# Patient Record
Sex: Female | Born: 1995 | Race: White | Hispanic: No | Marital: Single | Smoking: Never smoker
Health system: Southern US, Community
[De-identification: ages and names within clinical notes are randomized; demographics above are authoritative.]

## PROBLEM LIST (undated history)

## (undated) DIAGNOSIS — K219 Gastro-esophageal reflux disease without esophagitis: Secondary | ICD-10-CM

## (undated) DIAGNOSIS — I10 Essential (primary) hypertension: Secondary | ICD-10-CM

## (undated) DIAGNOSIS — R1013 Epigastric pain: Secondary | ICD-10-CM

## (undated) DIAGNOSIS — E785 Hyperlipidemia, unspecified: Secondary | ICD-10-CM

## (undated) DIAGNOSIS — T7840XA Allergy, unspecified, initial encounter: Secondary | ICD-10-CM

## (undated) HISTORY — DX: Epigastric pain: R10.13

## (undated) HISTORY — PX: WISDOM TOOTH EXTRACTION: SHX21

## (undated) HISTORY — DX: Hyperlipidemia, unspecified: E78.5

## (undated) HISTORY — DX: Allergy, unspecified, initial encounter: T78.40XA

## (undated) HISTORY — DX: Essential (primary) hypertension: I10

## (undated) HISTORY — DX: Gastro-esophageal reflux disease without esophagitis: K21.9

---
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
## 2019-06-20 ENCOUNTER — Emergency Department (HOSPITAL_COMMUNITY)
Admission: EM | Admit: 2019-06-20 | Discharge: 2019-06-21 | Disposition: A | Discharge status: Home / Self Care | Payer: BC Managed Care – PPO | Attending: Emergency Medicine | Admitting: Emergency Medicine

## 2019-06-20 ENCOUNTER — Emergency Department (HOSPITAL_COMMUNITY): Payer: BC Managed Care – PPO

## 2019-06-20 ENCOUNTER — Other Ambulatory Visit: Payer: Self-pay

## 2019-06-20 ENCOUNTER — Encounter (HOSPITAL_COMMUNITY): Payer: Self-pay | Admitting: Emergency Medicine

## 2019-06-20 DIAGNOSIS — U071 COVID-19: Secondary | ICD-10-CM

## 2019-06-20 DIAGNOSIS — R05 Cough: Secondary | ICD-10-CM | POA: Diagnosis present

## 2019-06-20 DIAGNOSIS — R079 Chest pain, unspecified: Secondary | ICD-10-CM | POA: Insufficient documentation

## 2019-06-20 DIAGNOSIS — M79605 Pain in left leg: Secondary | ICD-10-CM | POA: Diagnosis not present

## 2019-06-20 LAB — CBC
HCT: 40.7 % (ref 36.0–46.0)
Hemoglobin: 13 g/dL (ref 12.0–15.0)
MCH: 27.5 pg (ref 26.0–34.0)
MCHC: 31.9 g/dL (ref 30.0–36.0)
MCV: 86 fL (ref 80.0–100.0)
Platelets: 351 10*3/uL (ref 150–400)
RBC: 4.73 MIL/uL (ref 3.87–5.11)
RDW: 12.5 % (ref 11.5–15.5)
WBC: 7.6 10*3/uL (ref 4.0–10.5)
nRBC: 0 % (ref 0.0–0.2)

## 2019-06-20 LAB — I-STAT BETA HCG BLOOD, ED (MC, WL, AP ONLY): I-stat hCG, quantitative: <5 m[IU]/mL (ref <5)

## 2019-06-20 MED ORDER — SODIUM CHLORIDE 0.9% FLUSH: 3.0000 mL | Freq: Once | INTRAVENOUS | Status: DC

## 2019-06-20 NOTE — ED Triage Notes (Signed)
Pt states she was diagnosed Covid + last Saturday, today she started having some calf cramping, cp and SOB, concern about blood clots.

## 2019-06-21 ENCOUNTER — Emergency Department (HOSPITAL_COMMUNITY): Payer: BC Managed Care – PPO

## 2019-06-21 ENCOUNTER — Ambulatory Visit (HOSPITAL_BASED_OUTPATIENT_CLINIC_OR_DEPARTMENT_OTHER)
Admission: RE | Admit: 2019-06-21 | Discharge: 2019-06-21 | Disposition: A | Discharge status: Home / Self Care | Payer: BC Managed Care – PPO | Source: Ambulatory Visit | Attending: Physician Assistant | Admitting: Physician Assistant

## 2019-06-21 DIAGNOSIS — M79609 Pain in unspecified limb: Secondary | ICD-10-CM | POA: Diagnosis not present

## 2019-06-21 DIAGNOSIS — U071 COVID-19: Secondary | ICD-10-CM

## 2019-06-21 LAB — BASIC METABOLIC PANEL
Anion gap: 10 (ref 5–15)
BUN: 9 mg/dL (ref 6–20)
CO2: 24 mmol/L (ref 22–32)
Calcium: 8.9 mg/dL (ref 8.9–10.3)
Chloride: 105 mmol/L (ref 98–111)
Creatinine, Ser: 0.81 mg/dL (ref 0.44–1.00)
GFR calc Af Amer: >60 mL/min (ref >60)
GFR calc non Af Amer: >60 mL/min (ref >60)
Glucose, Bld: 115 mg/dL — ABNORMAL HIGH (ref 70–99)
Potassium: 3.7 mmol/L (ref 3.5–5.1)
Sodium: 139 mmol/L (ref 135–145)

## 2019-06-21 LAB — TROPONIN I (HIGH SENSITIVITY)
Troponin I (High Sensitivity): 3 ng/L (ref <18)
Troponin I (High Sensitivity): 6 ng/L (ref <18)

## 2019-06-21 LAB — D-DIMER, QUANTITATIVE (NOT AT ARMC): D-Dimer, Quant: 0.9 ug/mL-FEU — ABNORMAL HIGH (ref 0.00–0.50)

## 2019-06-21 MED ORDER — IOHEXOL 350 MG/ML SOLN
75.0000 mL | Freq: Once | INTRAVENOUS | Status: AC | PRN
  Administered 2019-06-21: 75 mL via INTRAVENOUS

## 2019-06-21 MED ORDER — ENOXAPARIN SODIUM 80 MG/0.8ML ~~LOC~~ SOLN
80.0000 mg | Freq: Once | SUBCUTANEOUS | Status: AC
  Administered 2019-06-21: 80 mg via SUBCUTANEOUS
  Filled 2019-06-21: qty 0.8

## 2019-06-21 NOTE — ED Notes (Signed)
Pt verbalized understanding of d/c instructions pt transported to [facility] via [facility]

## 2019-06-21 NOTE — Discharge Instructions (Addendum)
1. Medications: usual home medications 2. Treatment: rest, drink plenty of fluids,  3. Follow Up: Please utilize the instructions below to return for venous Doppler. If you do not have a primary care doctor use the resource guide provided to find one. Please return to the ER for worsening chest pain, shortness of breath or other concerns.

## 2019-06-21 NOTE — ED Provider Notes (Signed)
[facility] EMERGENCY DEPARTMENT Provider Note   CSN: [id number] Arrival date & time: 06/20/19  2212     History Chief Complaint  Patient presents with  . Chest Pain    Cassidy Shaffer is a 24 y.o. female with a hx of no major medical problems presents to the Emergency Department complaining of gradual, persistent, progressively worsening cough and left leg pain onset yesterday.  Patient reports she was diagnosed with COVID-19 on 06/11/2019.  Patient reports that her sister was recently diagnosed as well.  She reports she has been confined to her room for the last 10 days so has not been particularly mobile.  She does not take a birth control, no recent surgeries, no history of DVT.  Patient reports that yesterday her left calf began to feel "crampy" and did not resolve with massage or walking.  Patient reports mild chest pain and shortness of breath.  No dyspnea on exertion.  She does report she is concerned about possible pulmonary embolism.  She denies palpitations, noticeable leg swelling.  Nothing seems to make her symptoms better or worse.   The history is provided by the patient and medical records. No language interpreter was used.       History reviewed. No pertinent past medical history.  There are no problems to display for this patient.   History reviewed. No pertinent surgical history.   OB History   No obstetric history on file.     No family history on file.  Social History   Tobacco Use  . Smoking status: Never Smoker  . Smokeless tobacco: Never Used  Substance Use Topics  . Alcohol use: Never  . Drug use: Never    Home Medications Prior to Admission medications   Not on File    Allergies    Patient has no known allergies.  Review of Systems   Review of Systems  Constitutional: Negative for appetite change, diaphoresis, fatigue, fever and unexpected weight change.  HENT: Negative for mouth sores.   Eyes: Negative for visual  disturbance.  Respiratory: Positive for cough and shortness of breath. Negative for chest tightness and wheezing.   Cardiovascular: Positive for chest pain.  Gastrointestinal: Negative for abdominal pain, constipation, diarrhea, nausea and vomiting.  Endocrine: Negative for polydipsia, polyphagia and polyuria.  Genitourinary: Negative for dysuria, frequency, hematuria and urgency.  Musculoskeletal: Positive for myalgias (left leg). Negative for back pain and neck stiffness.  Skin: Negative for rash.  Allergic/Immunologic: Negative for immunocompromised state.  Neurological: Negative for syncope, light-headedness and headaches.  Hematological: Does not bruise/bleed easily.  Psychiatric/Behavioral: Negative for sleep disturbance. The patient is not nervous/anxious.     Physical Exam Updated Vital Signs BP (!) 150/94 (BP Location: Right Arm)   Pulse 97   Temp 98.3 F (36.8 C) (Oral)   Resp 18   Ht 5\' 7"  (1.702 m)   Wt 77.1 kg   SpO2 99%   BMI 26.63 kg/m   Physical Exam Vitals and nursing note reviewed.  Constitutional:      General: She is not in acute distress.    Appearance: She is not diaphoretic.  HENT:     Head: Normocephalic.  Eyes:     General: No scleral icterus.    Conjunctiva/sclera: Conjunctivae normal.  Cardiovascular:     Rate and Rhythm: Regular rhythm. Tachycardia present.     Pulses: Normal pulses.          Radial pulses are 2+ on the right side and  2+ on the left side.  Pulmonary:     Effort: No tachypnea, accessory muscle usage, prolonged expiration, respiratory distress or retractions.     Breath sounds: Normal breath sounds. No stridor.     Comments: Equal chest rise. No increased work of breathing. Abdominal:     General: There is no distension.     Palpations: Abdomen is soft.     Tenderness: There is no abdominal tenderness. There is no guarding or rebound.  Musculoskeletal:     Cervical back: Normal range of motion.     Right lower leg: No  tenderness. No edema.     Left lower leg: Tenderness ( Mild calf tenderness, no palpable cord) present. No edema.     Comments: Moves all extremities equally and without difficulty.  Skin:    General: Skin is warm and dry.     Capillary Refill: Capillary refill takes less than 2 seconds.  Neurological:     Mental Status: She is alert.     GCS: GCS eye subscore is 4. GCS verbal subscore is 5. GCS motor subscore is 6.     Comments: Speech is clear and goal oriented.  Psychiatric:        Mood and Affect: Mood normal.     ED Results / Procedures / Treatments   Labs (all labs ordered are listed, but only abnormal results are displayed) Labs Reviewed  BASIC METABOLIC PANEL - Abnormal; Notable for the following components:      Result Value   Glucose, Bld 115 (*)    All other components within normal limits  D-DIMER, QUANTITATIVE (NOT AT [facility]) - Abnormal; Notable for the following components:   D-Dimer, Quant 0.90 (*)    All other components within normal limits  CBC  I-STAT BETA HCG BLOOD, ED ([facility], [facility], [facility] ONLY)  TROPONIN I (HIGH SENSITIVITY)  TROPONIN I (HIGH SENSITIVITY)    EKG EKG Interpretation  Date/Time:  Monday Jun 21 2019 03:35:29 EDT Ventricular Rate:  114 PR Interval:  144 QRS Duration: 94 QT Interval:  355 QTC Calculation: 489 R Axis:   84 Text Interpretation: Sinus tachycardia Borderline prolonged QT interval No significant change was found Confirmed by Glynn Octave [phone]) on 06/21/2019 3:41:12 AM   Radiology DG Chest 2 View  Result Date: 06/20/2019 CLINICAL DATA:  Chest pain. EXAM: CHEST - 2 VIEW COMPARISON:  None. FINDINGS: The heart size and mediastinal contours are within normal limits. Both lungs are clear. The visualized skeletal structures are unremarkable. IMPRESSION: No active cardiopulmonary disease. Electronically Signed   By: Aram Candela M.D.   On: 06/20/2019 22:50   CT Angio Chest PE W and/or Wo Contrast  Result Date: 06/21/2019 CLINICAL  DATA:  Chest pain and shortness of breath. COVID positive. EXAM: CT ANGIOGRAPHY CHEST WITH CONTRAST TECHNIQUE: Multidetector CT imaging of the chest was performed using the standard protocol during bolus administration of intravenous contrast. Multiplanar CT image reconstructions and MIPs were obtained to evaluate the vascular anatomy. CONTRAST:  87mL OMNIPAQUE IOHEXOL 350 MG/ML SOLN COMPARISON:  Chest x-ray from same day. FINDINGS: Cardiovascular: Satisfactory opacification of the pulmonary arteries to the segmental level. No evidence of pulmonary embolism. Normal heart size. No pericardial effusion. Mediastinum/Nodes: No enlarged mediastinal, hilar, or axillary lymph nodes. Thyroid gland, trachea, and esophagus demonstrate no significant findings. Lungs/Pleura: Lungs are clear. No pleural effusion or pneumothorax. Upper Abdomen: No acute abnormality. Musculoskeletal: No chest wall abnormality. No acute or significant osseous findings. Review of the MIP images confirms the  above findings. IMPRESSION: No evidence of pulmonary embolism. No acute intrathoracic process. Electronically Signed   By: Obie Dredge M.D.   On: 06/21/2019 06:20    Procedures Procedures (including critical care time)  Medications Ordered in ED Medications  sodium chloride flush (NS) 0.9 % injection 3 mL (has no administration in time range)  enoxaparin (LOVENOX) injection 80 mg (has no administration in time range)  iohexol (OMNIPAQUE) 350 MG/ML injection 75 mL (75 mLs Intravenous Contrast Given 06/21/19 0537)    ED Course  I have reviewed the triage vital signs and the nursing notes.  Pertinent labs & imaging results that were available during my care of the patient were reviewed by me and considered in my medical decision making (see chart for details).    MDM Rules/Calculators/A&P                       Patient presents approximately 10 days after being diagnosed with Covid with new onset chest pain, shortness of  breath and left calf tenderness.  Concern for possible pulmonary embolism.  D-dimer elevated.  CT scan pending.  No hypoxia on exam.  Patient does have mild tachycardia.  6:41 AM CT scan without evidence of pulmonary embolism.  Do believe patient is likely high risk for DVT.  Will give Lovenox and schedule for outpatient venous duplex this morning.  Discussed reasons to return immediately to the emergency department.  Patient states understanding and is in agreement with the plan.   Final Clinical Impression(s) / ED Diagnoses Final diagnoses:  Pain of left lower extremity  COVID-19  Nonspecific chest pain    Rx / DC Orders ED Discharge Orders         Ordered    VAS [place] LOWER EXTREMITY VENOUS (DVT)     06/21/19 0639           Gloris Shiroma, Dahlia Client, PA-C 06/21/19 2683    Glynn Octave, MD 06/21/19 [phone]

## 2019-11-15 ENCOUNTER — Ambulatory Visit: Payer: BC Managed Care – PPO | Attending: Internal Medicine

## 2019-11-15 DIAGNOSIS — Z23 Encounter for immunization: Secondary | ICD-10-CM

## 2019-11-15 NOTE — Progress Notes (Signed)
   Covid-19 Vaccination Clinic  Name:  Cassidy Shaffer    MRN: [id number] DOB: Nov 30, 1995  11/15/2019  [name] was observed post Covid-19 immunization for 15 minutes without incident. She was provided with Vaccine Information Sheet and instruction to access the V-Safe system.   [name] was instructed to call 911 with any severe reactions post vaccine: Marland Kitchen Difficulty breathing  . Swelling of face and throat  . A fast heartbeat  . A bad rash all over body  . Dizziness and weakness   Immunizations Administered    Name Date Dose VIS Date Route   Pfizer COVID-19 Vaccine 11/15/2019 11:31 AM 0.3 mL 04/07/2018 Intramuscular   Manufacturer: [employer], [employer]   Lot: [id number]   NDC: [address]-8

## 2019-11-16 ENCOUNTER — Other Ambulatory Visit: Payer: Self-pay

## 2019-11-16 ENCOUNTER — Emergency Department (HOSPITAL_BASED_OUTPATIENT_CLINIC_OR_DEPARTMENT_OTHER)
Admission: EM | Admit: 2019-11-16 | Discharge: 2019-11-16 | Disposition: A | Discharge status: Home / Self Care | Payer: BC Managed Care – PPO | Attending: Emergency Medicine | Admitting: Emergency Medicine

## 2019-11-16 ENCOUNTER — Encounter (HOSPITAL_BASED_OUTPATIENT_CLINIC_OR_DEPARTMENT_OTHER): Payer: Self-pay | Admitting: *Deleted

## 2019-11-16 ENCOUNTER — Emergency Department (HOSPITAL_BASED_OUTPATIENT_CLINIC_OR_DEPARTMENT_OTHER): Payer: BC Managed Care – PPO

## 2019-11-16 DIAGNOSIS — R000 Tachycardia, unspecified: Secondary | ICD-10-CM | POA: Insufficient documentation

## 2019-11-16 DIAGNOSIS — R072 Precordial pain: Secondary | ICD-10-CM | POA: Diagnosis not present

## 2019-11-16 DIAGNOSIS — R0789 Other chest pain: Secondary | ICD-10-CM

## 2019-11-16 DIAGNOSIS — M79602 Pain in left arm: Secondary | ICD-10-CM | POA: Insufficient documentation

## 2019-11-16 LAB — BASIC METABOLIC PANEL
Anion gap: 8 (ref 5–15)
BUN: 7 mg/dL (ref 6–20)
CO2: 23 mmol/L (ref 22–32)
Calcium: 9 mg/dL (ref 8.9–10.3)
Chloride: 105 mmol/L (ref 98–111)
Creatinine, Ser: 0.62 mg/dL (ref 0.44–1.00)
GFR calc non Af Amer: >60 mL/min (ref >60)
Glucose, Bld: 90 mg/dL (ref 70–99)
Potassium: 3.6 mmol/L (ref 3.5–5.1)
Sodium: 136 mmol/L (ref 135–145)

## 2019-11-16 LAB — D-DIMER, QUANTITATIVE: D-Dimer, Quant: 0.65 ug/mL-FEU — ABNORMAL HIGH (ref 0.00–0.50)

## 2019-11-16 LAB — CBC WITH DIFFERENTIAL/PLATELET
Abs Immature Granulocytes: 0.02 10*3/uL (ref 0.00–0.07)
Basophils Absolute: 0.1 10*3/uL (ref 0.0–0.1)
Basophils Relative: 1 %
Eosinophils Absolute: 0 10*3/uL (ref 0.0–0.5)
Eosinophils Relative: 1 %
HCT: 39 % (ref 36.0–46.0)
Hemoglobin: 12.9 g/dL (ref 12.0–15.0)
Immature Granulocytes: 0 %
Lymphocytes Relative: 8 %
Lymphs Abs: 0.6 10*3/uL — ABNORMAL LOW (ref 0.7–4.0)
MCH: 28.5 pg (ref 26.0–34.0)
MCHC: 33.1 g/dL (ref 30.0–36.0)
MCV: 86.3 fL (ref 80.0–100.0)
Monocytes Absolute: 0.6 10*3/uL (ref 0.1–1.0)
Monocytes Relative: 8 %
Neutro Abs: 5.4 10*3/uL (ref 1.7–7.7)
Neutrophils Relative %: 82 %
Platelets: 263 10*3/uL (ref 150–400)
RBC: 4.52 MIL/uL (ref 3.87–5.11)
RDW: 12.5 % (ref 11.5–15.5)
WBC: 6.6 10*3/uL (ref 4.0–10.5)
nRBC: 0 % (ref 0.0–0.2)

## 2019-11-16 LAB — TROPONIN I (HIGH SENSITIVITY)
Troponin I (High Sensitivity): <2 ng/L (ref <18)
Troponin I (High Sensitivity): <2 ng/L (ref <18)

## 2019-11-16 MED ORDER — IOHEXOL 350 MG/ML SOLN
100.0000 mL | Freq: Once | INTRAVENOUS | Status: AC | PRN
  Administered 2019-11-16: 75 mL via INTRAVENOUS

## 2019-11-16 NOTE — ED Provider Notes (Signed)
[facility] EMERGENCY DEPARTMENT Provider Note   CSN: [id number] Arrival date & time: 11/16/19  0254     History Chief Complaint  Patient presents with  . Chest Pain    Cassidy Shaffer is a 24 y.o. female.  HPI   Patient with no significant medical history presents to emergency department with chief complaint of intermittent substernal chest pain and left arm pain x3 weeks.  Patient states this pain started after she got her Covid shot 3 weeks ago.  She states she will have episodic chest pain, it is sometimes substernal or in her upper left pec.  She describes it as a  dull pain that will last sometimes 5 minutes or up until an hour.  She denies any associated symptoms like becoming, short of breath, diaphoretic, radiating pain, paresthesias, nausea, vomiting, feeling lightheaded, not exertional.  She has no cardiac history, no history of DVTs or PEs, not on birth control.  Patient states it hurts more when she palpates the area.  She also endorses that her left arm has been hurting intermittently since getting her Covid shot 3 weeks ago.  She states she just got her second Covid shot yesterday and had increased pain in that left arm.  She denies any paresthesias, weakness, decreased range of motion in that left arm.  Patient denies any alleviating factors.  Patient denies headache, fever, chills, shortness of breath, abdominal pain, nausea, vomiting, diarrhea, pedal edema.  History reviewed. No pertinent past medical history.  There are no problems to display for this patient.   Past Surgical History:  Procedure Laterality Date  . WISDOM TOOTH EXTRACTION       OB History   No obstetric history on file.     History reviewed. No pertinent family history.  Social History   Tobacco Use  . Smoking status: Never Smoker  . Smokeless tobacco: Never Used  Substance Use Topics  . Alcohol use: Never  . Drug use: Never    Home Medications Prior to Admission  medications   Not on File    Allergies    Patient has no known allergies.  Review of Systems   Review of Systems  Constitutional: Negative for chills and fever.  HENT: Negative for congestion, sore throat, tinnitus, trouble swallowing and voice change.   Eyes: Negative for visual disturbance.  Respiratory: Negative for cough and shortness of breath.   Cardiovascular: Positive for chest pain. Negative for palpitations and leg swelling.  Gastrointestinal: Negative for abdominal pain, diarrhea, nausea and vomiting.  Genitourinary: Negative for dysuria, enuresis, flank pain and genital sores.  Musculoskeletal: Negative for back pain.       Left arm stiffness.  Skin: Negative for rash.  Neurological: Negative for dizziness and headaches.  Hematological: Does not bruise/bleed easily.    Physical Exam Updated Vital Signs BP 118/87 (BP Location: Right Arm)   Pulse 95   Temp 99.1 F (37.3 C) (Oral)   Resp 16   Ht 5\' 7"  (1.702 m)   Wt 77.1 kg   LMP 11/04/2019   SpO2 100%   BMI 26.63 kg/m   Physical Exam Vitals and nursing note reviewed.  Constitutional:      General: She is not in acute distress.    Appearance: She is not ill-appearing.  HENT:     Head: Normocephalic and atraumatic.     Nose: No congestion.     Mouth/Throat:     Mouth: Mucous membranes are moist.     Pharynx:  Oropharynx is clear.  Eyes:     General: No scleral icterus. Cardiovascular:     Rate and Rhythm: Regular rhythm. Tachycardia present.     Pulses: Normal pulses.     Heart sounds: No murmur heard.  No friction rub. No gallop.      Comments: Patient's chest was visualized, no gross abnormalities noted, good rise and fall during respirations, tenderness to palpation along her sternum. Pulmonary:     Effort: No respiratory distress.     Breath sounds: No wheezing, rhonchi or rales.  Chest:     Chest wall: No tenderness.  Abdominal:     General: There is no distension.     Palpations: Abdomen is  soft.     Tenderness: There is no abdominal tenderness. There is no right CVA tenderness, left CVA tenderness or guarding.  Musculoskeletal:        General: No swelling.     Right lower leg: No edema.     Left lower leg: No edema.  Skin:    General: Skin is warm and dry.     Capillary Refill: Capillary refill takes less than 2 seconds.     Findings: No rash.  Neurological:     Mental Status: She is alert.  Psychiatric:        Mood and Affect: Mood normal.     ED Results / Procedures / Treatments   Labs (all labs ordered are listed, but only abnormal results are displayed) Labs Reviewed  D-DIMER, QUANTITATIVE (NOT AT [facility]) - Abnormal; Notable for the following components:      Result Value   D-Dimer, Quant 0.65 (*)    All other components within normal limits  CBC WITH DIFFERENTIAL/PLATELET - Abnormal; Notable for the following components:   Lymphs Abs 0.6 (*)    All other components within normal limits  BASIC METABOLIC PANEL  TROPONIN I (HIGH SENSITIVITY)  TROPONIN I (HIGH SENSITIVITY)    EKG EKG Interpretation  Date/Time:  Tuesday November 16 2019 09:00:19 EDT Ventricular Rate:  92 PR Interval:    QRS Duration: 94 QT Interval:  343 QTC Calculation: 425 R Axis:   75 Text Interpretation: Sinus rhythm Confirmed by Marianna Fuss ([id number]) on 11/16/2019 9:25:50 AM   Radiology CT Angio Chest PE W and/or Wo Contrast  Result Date: 11/16/2019 CLINICAL DATA:  Chest pain for 2 weeks. EXAM: CT ANGIOGRAPHY CHEST WITH CONTRAST TECHNIQUE: Multidetector CT imaging of the chest was performed using the standard protocol during bolus administration of intravenous contrast. Multiplanar CT image reconstructions and MIPs were obtained to evaluate the vascular anatomy. CONTRAST:  78mL OMNIPAQUE IOHEXOL 350 MG/ML SOLN COMPARISON:  Chest radiograph performed the same day and CT chest dated 06/21/2019. FINDINGS: Cardiovascular: Satisfactory opacification of the pulmonary arteries to the  segmental level. No evidence of pulmonary embolism. Normal heart size. No pericardial effusion. Mediastinum/Nodes: No enlarged mediastinal, hilar, or axillary lymph nodes. Thyroid gland, trachea, and esophagus demonstrate no significant findings. Lungs/Pleura: Lungs are clear. No pleural effusion or pneumothorax. Upper Abdomen: No acute abnormality. Musculoskeletal: No chest wall abnormality. No acute or significant osseous findings. Review of the MIP images confirms the above findings. IMPRESSION: Negative for pulmonary embolism or acute pulmonary process. Electronically Signed   By: Romona Curls M.D.   On: 11/16/2019 11:48   DG Chest Port 1 View  Result Date: 11/16/2019 CLINICAL DATA:  Chest pain EXAM: PORTABLE CHEST 1 VIEW COMPARISON:  Jun 20, 2019 chest radiograph; Jun 21, 2019 chest CT FINDINGS: Lungs are  clear. Heart size and pulmonary vascularity are normal. No adenopathy. No pneumothorax. No bone lesions. IMPRESSION: Lungs clear.  Cardiac silhouette normal. Electronically Signed   By: Bretta Bang III M.D.   On: 11/16/2019 10:04    Procedures Procedures (including critical care time)  Medications Ordered in ED Medications  iohexol (OMNIPAQUE) 350 MG/ML injection 100 mL (75 mLs Intravenous Contrast Given 11/16/19 1125)    ED Course  I have reviewed the triage vital signs and the nursing notes.  Pertinent labs & imaging results that were available during my care of the patient were reviewed by me and considered in my medical decision making (see chart for details).  Clinical Course as of Nov 15 1225  Tue Nov 16, 2019  1049 D-Dimer, Quant(!): 0.65 [WF]    Clinical Course User Index [WF] Barnie Del   MDM Rules/Calculators/A&P                         I have personally reviewed all imaging, labs and have interpreted them.  Patient presents with chest pain started at 6:30 AM and left arm pain.  She was alert, did not appear in acute distress, vital signs significant  for tachycardia.  Will order chest pain work-up and D-dimer for further evaluation.   BMP does not show electrolyte abnormalities, no metabolic acidosis, no signs of AKI, no anion gap.  CBC negative for leukocytosis or anemia.  D-dimer slightly elevated at 0.65 will send for CT angio for PE rule out.  Initial troponin less than 2. CT angio was negative for PE, no other acute findings noted.  Chest x-ray was performed negative for acute findings.  EKG shows sinus without signs of ischemia no ST elevation or depression noted.  I have low suspicion patient suffering from ACS as she has a heart score of 0, initial troponin was less than 2, EKG did not show signs of ischemia.  Second troponin was not indicated per heart score pathway.  I suspect initial tachycardia secondary to anxiety as patient states she was very nervous coming in to be evaluated.  Low suspicion for PE as patient CT angio came back unremarkable.  Low suspicion for DVT as patient denies leg pain, leg swelling, no history of DVTs in the past.  Low suspicion for systemic infection as patient is nontoxic-appearing, vital signs reassuring, no obvious source infection on exam, chest x-ray was negative.  Low suspicion for AAA as patient denies stabbing pain in the center of her back, low risk factors.  I suspect patient suffering from muscular strain.  But differential diagnosis includes costochondritis, anxiety, acid reflux.  Will recommend over-the-counter pain medication and follow-up with PCP for further evaluation.  Patient vital signs have remained stable, no indication for hospital admission.  Patient is given at home care and  strict return precautions.  Patient verbalized understood and agreed to plan.   Final Clinical Impression(s) / ED Diagnoses Final diagnoses:  Atypical chest pain    Rx / DC Orders ED Discharge Orders    None       Carroll Sage, PA-C 11/16/19 1227    Milagros Loll, MD 11/17/19 9509     Milagros Loll, MD 11/17/19 [phone]

## 2019-11-16 NOTE — Discharge Instructions (Signed)
Seen here for chest pain, lab work and imaging look reassuring.  I recommend taking over-the-counter pain medications like ibuprofen and or Tylenol every 6 as needed for pain.  Please follow dosing the back of bottle.  I want you to follow-up with your PCP for further evaluation management if symptoms persist.  Please come back to emergency department if you develop severe chest pain, shortness of breath, severe abdominal pain, uncontrolled nausea, vomiting, diarrhea.

## 2019-11-16 NOTE — ED Triage Notes (Signed)
Chest pain for 2 weeks

## 2019-11-22 ENCOUNTER — Encounter: Payer: Self-pay | Admitting: Nurse Practitioner

## 2019-12-01 ENCOUNTER — Other Ambulatory Visit: Payer: Self-pay

## 2019-12-01 ENCOUNTER — Ambulatory Visit (INDEPENDENT_AMBULATORY_CARE_PROVIDER_SITE_OTHER): Payer: BC Managed Care – PPO | Admitting: Nurse Practitioner

## 2019-12-01 ENCOUNTER — Encounter: Payer: Self-pay | Admitting: Nurse Practitioner

## 2019-12-01 VITALS — BP 128/78 | HR 68 | Temp 98.1°F | Ht 66.5 in | Wt 156.0 lb

## 2019-12-01 DIAGNOSIS — R053 Chronic cough: Secondary | ICD-10-CM | POA: Diagnosis not present

## 2019-12-01 DIAGNOSIS — U099 Post covid-19 condition, unspecified: Secondary | ICD-10-CM

## 2019-12-01 DIAGNOSIS — K219 Gastro-esophageal reflux disease without esophagitis: Secondary | ICD-10-CM

## 2019-12-01 DIAGNOSIS — Z0000 Encounter for general adult medical examination without abnormal findings: Secondary | ICD-10-CM | POA: Insufficient documentation

## 2019-12-01 MED ORDER — OMEPRAZOLE 20 MG PO CPDR: 20.0000 mg | DELAYED_RELEASE_CAPSULE | Freq: Every day | ORAL | 1 refills | Status: DC

## 2019-12-01 NOTE — Progress Notes (Signed)
New Patient Office Visit  Subjective:  Patient ID: Cassidy Shaffer, female    DOB: 03-26-1995  Age: 24 y.o. MRN: [id number]  CC:  Chief Complaint  Patient presents with  . New to establish    Side effects after second COVID vaccine    HPI Cassidy Shaffer is a 24 year old Caucasian female that is here to for physical exam to establish care. She is a Charity fundraiser at [employer]. Recently was required to receive COVID-19 vaccination on 10/25/19 and 11/15/19. Developed intermittent side effects that included mid-sternal chest pain, palpitations, elevated D-dimer, dyspnea, left arm pain, GERD, and hot flashes. These symptoms have seemed to have resolved. She is concerned with receiving a COVID-19 booster. She is scheduled to receive flu vaccine the end of October, 2021.    Past Medical History:  Diagnosis Date  . GERD (gastroesophageal reflux disease)     Past Surgical History:  Procedure Laterality Date  . WISDOM TOOTH EXTRACTION      Family History  Problem Relation Age of Onset  . Hyperlipidemia Mother   . Osteoporosis Mother   . Fibromyalgia Mother   . Hypertension Father   . Esophagitis Father   . Hyperlipidemia Father   . Hypertension Maternal Grandmother   . Diabetes Mellitus II Maternal Grandfather   . COPD Maternal Grandfather   . Hyperlipidemia Maternal Grandfather   . Hypertension Maternal Grandfather   . Hypertension Paternal Grandmother   . Hyperlipidemia Paternal Grandmother   . Hypertension Paternal Grandfather   . Stroke Paternal Grandfather   . Hyperlipidemia Paternal Grandfather   . Heart attack Paternal Grandfather   . Pancreatic cancer Maternal Great-grandfather     Social History   Socioeconomic History  . Marital status: Single    Spouse name: Not on file  . Number of children: Not on file  . Years of education: Not on file  . Highest education level: Not on file  Occupational History  . Not on file  Tobacco Use  . Smoking status: Never Smoker  .  Smokeless tobacco: Never Used  Vaping Use  . Vaping Use: Never used  Substance and Sexual Activity  . Alcohol use: Never  . Drug use: Never  . Sexual activity: Not on file  Other Topics Concern  . Not on file  Social History Narrative  . Not on file   Social Determinants of Health   Financial Resource Strain:   . Difficulty of Paying Living Expenses: Not on file  Food Insecurity:   . Worried About Programme researcher, broadcasting/film/video in the Last Year: Not on file  . Ran Out of Food in the Last Year: Not on file  Transportation Needs:   . Lack of Transportation (Medical): Not on file  . Lack of Transportation (Non-Medical): Not on file  Physical Activity:   . Days of Exercise per Week: Not on file  . Minutes of Exercise per Session: Not on file  Stress:   . Feeling of Stress : Not on file  Social Connections:   . Frequency of Communication with Friends and Family: Not on file  . Frequency of Social Gatherings with Friends and Family: Not on file  . Attends Religious Services: Not on file  . Active Member of Clubs or Organizations: Not on file  . Attends Banker Meetings: Not on file  . Marital Status: Not on file  Intimate Partner Violence:   . Fear of Current or Ex-Partner: Not on file  . Emotionally Abused:  Not on file  . Physically Abused: Not on file  . Sexually Abused: Not on file    ROS Review of Systems  Constitutional: Positive for chills (after COVID-19 vaccine) and fatigue.  Respiratory: Positive for cough (After COVID-19 vaccine-subsided).   Cardiovascular: Positive for chest pain (resolved) and palpitations (RESOLVED).  Gastrointestinal: Positive for abdominal pain (left and right upper quadrant pain after vaccinations-resolved).       GERD  Musculoskeletal: Positive for arthralgias, back pain and myalgias.       Left knee and elbow after COVID-19 vaccines  Neurological: Positive for weakness, numbness (left arm-resolved) and headaches.    Objective:    Today's Vitals: BP 128/78 (BP Location: Left Arm, Patient Position: Sitting)   Pulse 68   Temp 98.1 F (36.7 C) (Temporal)   Ht 5' 6.5" (1.689 m)   Wt 156 lb (70.8 kg)   LMP 11/04/2019   SpO2 98%   BMI 24.80 kg/m   Physical Exam Vitals reviewed.  Constitutional:      Appearance: Normal appearance.  HENT:     Head: Normocephalic.     Right Ear: Tympanic membrane normal.     Left Ear: Tympanic membrane normal.     Nose: Nose normal.     Mouth/Throat:     Mouth: Mucous membranes are moist.  Eyes:     Pupils: Pupils are equal, round, and reactive to light.  Cardiovascular:     Rate and Rhythm: Normal rate and regular rhythm.     Pulses: Normal pulses.     Heart sounds: Normal heart sounds.  Pulmonary:     Effort: Pulmonary effort is normal.     Breath sounds: Normal breath sounds.  Abdominal:     General: Bowel sounds are normal.     Palpations: Abdomen is soft.  Genitourinary:    Comments: Deferred Musculoskeletal:        General: Normal range of motion.     Cervical back: Neck supple.  Skin:    General: Skin is warm and dry.     Capillary Refill: Capillary refill takes less than 2 seconds.  Neurological:     General: No focal deficit present.     Mental Status: She is alert and oriented to person, place, and time.  Psychiatric:        Mood and Affect: Mood normal.        Behavior: Behavior normal.     Assessment & Plan:   Problem List Items Addressed This Visit      Digestive   Gastroesophageal reflux disease without esophagitis   Relevant Medications   omeprazole (PRILOSEC) 20 MG capsule     Other   Encounter for medical examination to establish care - Primary   Relevant Orders   CBC with Differential   Comprehensive metabolic panel    Other Visit Diagnoses    Post-COVID chronic cough       Relevant Orders   SARS-CoV-2 Semi-Quantitative Total Antibody, Spike      Outpatient Encounter Medications as of 12/01/2019  Medication Sig  .  omeprazole (PRILOSEC) 20 MG capsule Take 1 capsule (20 mg total) by mouth daily.   No facility-administered encounter medications on file as of 12/01/2019.    Follow-up: No follow-ups on file.   Janie Morning, NP

## 2019-12-01 NOTE — Patient Instructions (Addendum)
Return in 45-monthfor follow-up of GERD  Preventive Care [age]Years Old, Female Preventive care refers to visits with your health care provider and lifestyle choices that can promote health and wellness. This includes:  A yearly physical exam. This may also be called an annual well check.  Regular dental visits and eye exams.  Immunizations.  Screening for certain conditions.  Healthy lifestyle choices, such as eating a healthy diet, getting regular exercise, not using drugs or products that contain nicotine and tobacco, and limiting alcohol use. What can I expect for my preventive care visit? Physical exam Your health care provider will check your:  Height and weight. This may be used to calculate body mass index (BMI), which tells if you are at a healthy weight.  Heart rate and blood pressure.  Skin for abnormal spots. Counseling Your health care provider may ask you questions about your:  Alcohol, tobacco, and drug use.  Emotional well-being.  Home and relationship well-being.  Sexual activity.  Eating habits.  Work and work eStatistician  Method of birth control.  Menstrual cycle.  Pregnancy history. What immunizations do I need?  Influenza (flu) vaccine  This is recommended every year. Tetanus, diphtheria, and pertussis (Tdap) vaccine  You may need a Td booster every 10 years. Varicella (chickenpox) vaccine  You may need this if you have not been vaccinated. Human papillomavirus (HPV) vaccine  If recommended by your health care provider, you may need three doses over 6 months. Measles, mumps, and rubella (MMR) vaccine  You may need at least one dose of MMR. You may also need a second dose. Meningococcal conjugate (MenACWY) vaccine  One dose is recommended if you are age 313-21 years and a first-year college student living in a residence hall, or if you have one of several medical conditions. You may also need additional booster doses. Pneumococcal  conjugate (PCV13) vaccine  You may need this if you have certain conditions and were not previously vaccinated. Pneumococcal polysaccharide (PPSV23) vaccine  You may need one or two doses if you smoke cigarettes or if you have certain conditions. Hepatitis A vaccine  You may need this if you have certain conditions or if you travel or work in places where you may be exposed to hepatitis A. Hepatitis B vaccine  You may need this if you have certain conditions or if you travel or work in places where you may be exposed to hepatitis B. Haemophilus influenzae type b (Hib) vaccine  You may need this if you have certain conditions. You may receive vaccines as individual doses or as more than one vaccine together in one shot (combination vaccines). Talk with your health care provider about the risks and benefits of combination vaccines. What tests do I need?  Blood tests  Lipid and cholesterol levels. These may be checked every 5 years starting at age 78  Hepatitis C test.  Hepatitis B test. Screening  Diabetes screening. This is done by checking your blood sugar (glucose) after you have not eaten for a while (fasting).  Sexually transmitted disease (STD) testing.  BRCA-related cancer screening. This may be done if you have a family history of breast, ovarian, tubal, or peritoneal cancers.  Pelvic exam and Pap test. This may be done every 3 years starting at age 762 Starting at age 24 this may be done every 5 years if you have a Pap test in combination with an HPV test. Talk with your health care provider about your test results, treatment options,  and if necessary, the need for more tests. Follow these instructions at home: Eating and drinking   Eat a diet that includes fresh fruits and vegetables, whole grains, lean protein, and low-fat dairy.  Take vitamin and mineral supplements as recommended by your health care provider.  Do not drink alcohol if: ? Your health care  provider tells you not to drink. ? You are pregnant, may be pregnant, or are planning to become pregnant.  If you drink alcohol: ? Limit how much you have to 0-1 drink a day. ? Be aware of how much alcohol is in your drink. In the [place] one drink equals one 12 oz bottle of beer (355 mL), one 5 oz glass of wine (148 mL), or one 1 oz glass of hard liquor (44 mL). Lifestyle  Take daily care of your teeth and gums.  Stay active. Exercise for at least 30 minutes on 5 or more days each week.  Do not use any products that contain nicotine or tobacco, such as cigarettes, e-cigarettes, and chewing tobacco. If you need help quitting, ask your health care provider.  If you are sexually active, practice safe sex. Use a condom or other form of birth control (contraception) in order to prevent pregnancy and STIs (sexually transmitted infections). If you plan to become pregnant, see your health care provider for a preconception visit. What's next?  Visit your health care provider once a year for a well check visit.  Ask your health care provider how often you should have your eyes and teeth checked.  Stay up to date on all vaccines. This information is not intended to replace advice given to you by your health care provider. Make sure you discuss any questions you have with your health care provider. Document Revised: 10/09/2017 Document Reviewed: 10/09/2017 Elsevier Patient Education  [place] Maintenance, Female Adopting a healthy lifestyle and getting preventive care are important in promoting health and wellness. Ask your health care provider about:  The right schedule for you to have regular tests and exams.  Things you can do on your own to prevent diseases and keep yourself healthy. What should I know about diet, weight, and exercise? Eat a healthy diet   Eat a diet that includes plenty of vegetables, fruits, low-fat dairy products, and lean protein.  Do not eat a lot  of foods that are high in solid fats, added sugars, or sodium. Maintain a healthy weight Body mass index (BMI) is used to identify weight problems. It estimates body fat based on height and weight. Your health care provider can help determine your BMI and help you achieve or maintain a healthy weight. Get regular exercise Get regular exercise. This is one of the most important things you can do for your health. Most adults should:  Exercise for at least 150 minutes each week. The exercise should increase your heart rate and make you sweat (moderate-intensity exercise).  Do strengthening exercises at least twice a week. This is in addition to the moderate-intensity exercise.  Spend less time sitting. Even light physical activity can be beneficial. Watch cholesterol and blood lipids Have your blood tested for lipids and cholesterol at 24 years of age, then have this test every 5 years. Have your cholesterol levels checked more often if:  Your lipid or cholesterol levels are high.  You are older than 24 years of age.  You are at high risk for heart disease. What should I know about cancer screening? Depending on your  health history and family history, you may need to have cancer screening at various ages. This may include screening for:  Breast cancer.  Cervical cancer.  Colorectal cancer.  Skin cancer.  Lung cancer. What should I know about heart disease, diabetes, and high blood pressure? Blood pressure and heart disease  High blood pressure causes heart disease and increases the risk of stroke. This is more likely to develop in people who have high blood pressure readings, are of African descent, or are overweight.  Have your blood pressure checked: ? Every 3-5 years if you are 72-59 years of age. ? Every year if you are 53 years old or older. Diabetes Have regular diabetes screenings. This checks your fasting blood sugar level. Have the screening done:  Once every three  years after age 33 if you are at a normal weight and have a low risk for diabetes.  More often and at a younger age if you are overweight or have a high risk for diabetes. What should I know about preventing infection? Hepatitis B If you have a higher risk for hepatitis B, you should be screened for this virus. Talk with your health care provider to find out if you are at risk for hepatitis B infection. Hepatitis C Testing is recommended for:  Everyone born from [birth year] through 1965.  Anyone with known risk factors for hepatitis C. Sexually transmitted infections (STIs)  Get screened for STIs, including gonorrhea and chlamydia, if: ? You are sexually active and are younger than 24 years of age. ? You are older than 24 years of age and your health care provider tells you that you are at risk for this type of infection. ? Your sexual activity has changed since you were last screened, and you are at increased risk for chlamydia or gonorrhea. Ask your health care provider if you are at risk.  Ask your health care provider about whether you are at high risk for HIV. Your health care provider may recommend a prescription medicine to help prevent HIV infection. If you choose to take medicine to prevent HIV, you should first get tested for HIV. You should then be tested every 3 months for as long as you are taking the medicine. Pregnancy  If you are about to stop having your period (premenopausal) and you may become pregnant, seek counseling before you get pregnant.  Take 400 to 800 micrograms (mcg) of folic acid every day if you become pregnant.  Ask for birth control (contraception) if you want to prevent pregnancy. Osteoporosis and menopause Osteoporosis is a disease in which the bones lose minerals and strength with aging. This can result in bone fractures. If you are 61 years old or older, or if you are at risk for osteoporosis and fractures, ask your health care provider if you should:  Be  screened for bone loss.  Take a calcium or vitamin D supplement to lower your risk of fractures.  Be given hormone replacement therapy (HRT) to treat symptoms of menopause. Follow these instructions at home: Lifestyle  Do not use any products that contain nicotine or tobacco, such as cigarettes, e-cigarettes, and chewing tobacco. If you need help quitting, ask your health care provider.  Do not use street drugs.  Do not share needles.  Ask your health care provider for help if you need support or information about quitting drugs. Alcohol use  Do not drink alcohol if: ? Your health care provider tells you not to drink. ? You are pregnant,  may be pregnant, or are planning to become pregnant.  If you drink alcohol: ? Limit how much you use to 0-1 drink a day. ? Limit intake if you are breastfeeding.  Be aware of how much alcohol is in your drink. In the [place] one drink equals one 12 oz bottle of beer (355 mL), one 5 oz glass of wine (148 mL), or one 1 oz glass of hard liquor (44 mL). General instructions  Schedule regular health, dental, and eye exams.  Stay current with your vaccines.  Tell your health care provider if: ? You often feel depressed. ? You have ever been abused or do not feel safe at home. Summary  Adopting a healthy lifestyle and getting preventive care are important in promoting health and wellness.  Follow your health care provider's instructions about healthy diet, exercising, and getting tested or screened for diseases.  Follow your health care provider's instructions on monitoring your cholesterol and blood pressure. This information is not intended to replace advice given to you by your health care provider. Make sure you discuss any questions you have with your health care provider. Document Revised: 01/21/2018 Document Reviewed: 01/21/2018 Elsevier Patient Education  [place]. Gastroesophageal Reflux Disease, Adult Gastroesophageal reflux  (GER) happens when acid from the stomach flows up into the tube that connects the mouth and the stomach (esophagus). Normally, food travels down the esophagus and stays in the stomach to be digested. With GER, food and stomach acid sometimes move back up into the esophagus. You may have a disease called gastroesophageal reflux disease (GERD) if the reflux:  Happens often.  Causes frequent or very bad symptoms.  Causes problems such as damage to the esophagus. When this happens, the esophagus becomes sore and swollen (inflamed). Over time, GERD can make small holes (ulcers) in the lining of the esophagus. What are the causes? This condition is caused by a problem with the muscle between the esophagus and the stomach. When this muscle is weak or not normal, it does not close properly to keep food and acid from coming back up from the stomach. The muscle can be weak because of:  Tobacco use.  Pregnancy.  Having a certain type of hernia (hiatal hernia).  Alcohol use.  Certain foods and drinks, such as coffee, chocolate, onions, and peppermint. What increases the risk? You are more likely to develop this condition if you:  Are overweight.  Have a disease that affects your connective tissue.  Use NSAID medicines. What are the signs or symptoms? Symptoms of this condition include:  Heartburn.  Difficult or painful swallowing.  The feeling of having a lump in the throat.  A bitter taste in the mouth.  Bad breath.  Having a lot of saliva.  Having an upset or bloated stomach.  Belching.  Chest pain. Different conditions can cause chest pain. Make sure you see your doctor if you have chest pain.  Shortness of breath or noisy breathing (wheezing).  Ongoing (chronic) cough or a cough at night.  Wearing away of the surface of teeth (tooth enamel).  Weight loss. How is this treated? Treatment will depend on how bad your symptoms are. Your doctor may suggest:  Changes to  your diet.  Medicine.  Surgery. Follow these instructions at home: Eating and drinking   Follow a diet as told by your doctor. You may need to avoid foods and drinks such as: ? Coffee and tea (with or without caffeine). ? Drinks that contain alcohol. ?  Energy drinks and sports drinks. ? Bubbly (carbonated) drinks or sodas. ? Chocolate and cocoa. ? Peppermint and mint flavorings. ? Garlic and onions. ? Horseradish. ? Spicy and acidic foods. These include peppers, chili powder, curry powder, vinegar, hot sauces, and BBQ sauce. ? Citrus fruit juices and citrus fruits, such as oranges, lemons, and limes. ? Tomato-based foods. These include red sauce, chili, salsa, and pizza with red sauce. ? Fried and fatty foods. These include donuts, french fries, potato chips, and high-fat dressings. ? High-fat meats. These include hot dogs, rib eye steak, sausage, ham, and bacon. ? High-fat dairy items, such as whole milk, butter, and cream cheese.  Eat small meals often. Avoid eating large meals.  Avoid drinking large amounts of liquid with your meals.  Avoid eating meals during the 2-3 hours before bedtime.  Avoid lying down right after you eat.  Do not exercise right after you eat. Lifestyle   Do not use any products that contain nicotine or tobacco. These include cigarettes, e-cigarettes, and chewing tobacco. If you need help quitting, ask your doctor.  Try to lower your stress. If you need help doing this, ask your doctor.  If you are overweight, lose an amount of weight that is healthy for you. Ask your doctor about a safe weight loss goal. General instructions  Pay attention to any changes in your symptoms.  Take over-the-counter and prescription medicines only as told by your doctor. Do not take aspirin, ibuprofen, or other NSAIDs unless your doctor says it is okay.  Wear loose clothes. Do not wear anything tight around your waist.  Raise (elevate) the head of your bed about  6 inches (15 cm).  Avoid bending over if this makes your symptoms worse.  Keep all follow-up visits as told by your doctor. This is important. Contact a doctor if:  You have new symptoms.  You lose weight and you do not know why.  You have trouble swallowing or it hurts to swallow.  You have wheezing or a cough that keeps happening.  Your symptoms do not get better with treatment.  You have a hoarse voice. Get help right away if:  You have pain in your arms, neck, jaw, teeth, or back.  You feel sweaty, dizzy, or light-headed.  You have chest pain or shortness of breath.  You throw up (vomit) and your throw-up looks like blood or coffee grounds.  You pass out (faint).  Your poop (stool) is bloody or black.  You cannot swallow, drink, or eat. Summary  If a person has gastroesophageal reflux disease (GERD), food and stomach acid move back up into the esophagus and cause symptoms or problems such as damage to the esophagus.  Treatment will depend on how bad your symptoms are.  Follow a diet as told by your doctor.  Take all medicines only as told by your doctor. This information is not intended to replace advice given to you by your health care provider. Make sure you discuss any questions you have with your health care provider. Document Revised: 08/06/2017 Document Reviewed: 08/06/2017 Elsevier Patient Education  2020 [place] for Gastroesophageal Reflux Disease, Adult When you have gastroesophageal reflux disease (GERD), the foods you eat and your eating habits are very important. Choosing the right foods can help ease your discomfort. Think about working with a nutrition specialist (dietitian) to help you make good choices. What are tips for following this plan?  Meals  Choose healthy foods that are low in fat,  such as fruits, vegetables, whole grains, low-fat dairy products, and lean meat, fish, and poultry.  Eat small meals often instead of 3  large meals a day. Eat your meals slowly, and in a place where you are relaxed. Avoid bending over or lying down until 2-3 hours after eating.  Avoid eating meals 2-3 hours before bed.  Avoid drinking a lot of liquid with meals.  Cook foods using methods other than frying. Bake, grill, or broil food instead.  Avoid or limit: ? Chocolate. ? Peppermint or spearmint. ? Alcohol. ? Pepper. ? Black and decaffeinated coffee. ? Black and decaffeinated tea. ? Bubbly (carbonated) soft drinks. ? Caffeinated energy drinks and soft drinks.  Limit high-fat foods such as: ? Fatty meat or fried foods. ? Whole milk, cream, butter, or ice cream. ? Nuts and nut butters. ? Pastries, donuts, and sweets made with butter or shortening.  Avoid foods that cause symptoms. These foods may be different for everyone. Common foods that cause symptoms include: ? Tomatoes. ? Oranges, lemons, and limes. ? Peppers. ? Spicy food. ? Onions and garlic. ? Vinegar. Lifestyle  Maintain a healthy weight. Ask your doctor what weight is healthy for you. If you need to lose weight, work with your doctor to do so safely.  Exercise for at least 30 minutes for 5 or more days each week, or as told by your doctor.  Wear loose-fitting clothes.  Do not smoke. If you need help quitting, ask your doctor.  Sleep with the head of your bed higher than your feet. Use a wedge under the mattress or blocks under the bed frame to raise the head of the bed. Summary  When you have gastroesophageal reflux disease (GERD), food and lifestyle choices are very important in easing your symptoms.  Eat small meals often instead of 3 large meals a day. Eat your meals slowly, and in a place where you are relaxed.  Limit high-fat foods such as fatty meat or fried foods.  Avoid bending over or lying down until 2-3 hours after eating.  Avoid peppermint and spearmint, caffeine, alcohol, and chocolate. This information is not intended to  replace advice given to you by your health care provider. Make sure you discuss any questions you have with your health care provider. Document Revised: 05/21/2018 Document Reviewed: 03/05/2016 Elsevier Patient Education  [place].

## 2019-12-02 LAB — CBC WITH DIFFERENTIAL/PLATELET
Absolute Monocytes: 747 cells/uL (ref 200–950)
Basophils Absolute: 77 cells/uL (ref 0–200)
Basophils Relative: 1 %
Eosinophils Absolute: 123 cells/uL (ref 15–500)
Eosinophils Relative: 1.6 %
HCT: 37.8 % (ref 35.0–45.0)
Hemoglobin: 12.5 g/dL (ref 11.7–15.5)
Lymphs Abs: 1717 cells/uL (ref 850–3900)
MCH: 28.7 pg (ref 27.0–33.0)
MCHC: 33.1 g/dL (ref 32.0–36.0)
MCV: 86.7 fL (ref 80.0–100.0)
MPV: 10.1 fL (ref 7.5–12.5)
Monocytes Relative: 9.7 %
Neutro Abs: 5036 cells/uL (ref 1500–7800)
Neutrophils Relative %: 65.4 %
Platelets: 294 10*3/uL (ref 140–400)
RBC: 4.36 10*6/uL (ref 3.80–5.10)
RDW: 12.7 % (ref 11.0–15.0)
Total Lymphocyte: 22.3 %
WBC: 7.7 10*3/uL (ref 3.8–10.8)

## 2019-12-02 LAB — COMPREHENSIVE METABOLIC PANEL
AG Ratio: 1.8 (calc) (ref 1.0–2.5)
ALT: 15 U/L (ref 6–29)
AST: 16 U/L (ref 10–30)
Albumin: 4.2 g/dL (ref 3.6–5.1)
Alkaline phosphatase (APISO): 64 U/L (ref 31–125)
BUN: 11 mg/dL (ref 7–25)
CO2: 28 mmol/L (ref 20–32)
Calcium: 9.3 mg/dL (ref 8.6–10.2)
Chloride: 104 mmol/L (ref 98–110)
Creat: 0.86 mg/dL (ref 0.50–1.10)
Globulin: 2.4 g/dL (calc) (ref 1.9–3.7)
Glucose, Bld: 80 mg/dL (ref 65–139)
Potassium: 4.1 mmol/L (ref 3.5–5.3)
Sodium: 138 mmol/L (ref 135–146)
Total Bilirubin: 0.4 mg/dL (ref 0.2–1.2)
Total Protein: 6.6 g/dL (ref 6.1–8.1)

## 2019-12-02 LAB — SARS-COV-2 SEMI-QUANTITATIVE TOTAL ANTIBODY, SPIKE
SARS-CoV-2 Semi-Quant Total Ab: >2500 U/mL (ref <0.8)
SARS-CoV-2 Spike Ab Interp: POSITIVE

## 2019-12-02 NOTE — Progress Notes (Signed)
CBC: Normal; KPQ:AESLPN; COVID-19 antibodies >2,500. Pt has history of +COVID test and 2 vaccines

## 2019-12-09 ENCOUNTER — Other Ambulatory Visit: Payer: Self-pay

## 2019-12-09 DIAGNOSIS — K219 Gastro-esophageal reflux disease without esophagitis: Secondary | ICD-10-CM

## 2019-12-10 ENCOUNTER — Encounter: Payer: Self-pay | Admitting: Gastroenterology

## 2019-12-28 ENCOUNTER — Encounter: Payer: Self-pay | Admitting: Gastroenterology

## 2019-12-28 ENCOUNTER — Ambulatory Visit (INDEPENDENT_AMBULATORY_CARE_PROVIDER_SITE_OTHER): Payer: BC Managed Care – PPO | Admitting: Gastroenterology

## 2019-12-28 DIAGNOSIS — K219 Gastro-esophageal reflux disease without esophagitis: Secondary | ICD-10-CM | POA: Diagnosis not present

## 2019-12-28 DIAGNOSIS — R1013 Epigastric pain: Secondary | ICD-10-CM

## 2019-12-28 NOTE — Progress Notes (Signed)
HPI: This is a very pleasant 24 year old woman who was referred to me by Janie Morning, NP  to evaluate indigestion, pyrosis, postprandial abdominal pains.    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  She had her first Covid shot September 13 and had some mild chest discomfort, some indigestion and joint pains afterwards.  After her second Covid vaccination in October 4 she felt even worse, the indigestion was worse, the acid reflux is worse, she was having some globus sensation.  She also noticed that eating would cause some pains in her back about an hour or 2 after eating.  Over time these postprandial discomforts changed to upper epigastric discomforts after she eats.  They sometimes can radiate to the right upper quadrant.  She does admit they are colicky.  She does not get nausea with them.  Her weight has been overall stable.   She presented to the emergency facility last month with "atypical chest pain" .  Her providers felt that she had muscular strain but the differential also included costochondritis, anxiety and acid reflux.  She was recommended to follow-up with her PCP.  She tells me that the omeprazole 20 mg pills 1 pill once daily has clearly helped with her indigestion and pyrosis but has not helped her postprandial discomforts  Old Data Reviewed: Blood work October 2021 showed normal complete metabolic profile and normal CBC.  CT scan angio of chest PE protocol done October 2021 was "negative for pulmonary embolism or acute pulmonary process"   Review of systems: Pertinent positive and negative review of systems were noted in the above HPI section. All other review negative.   Past Medical History:  Diagnosis Date  . GERD (gastroesophageal reflux disease)     Past Surgical History:  Procedure Laterality Date  . WISDOM TOOTH EXTRACTION      Current Outpatient Medications  Medication Sig Dispense Refill  . omeprazole (PRILOSEC) 20 MG capsule Take 1  capsule (20 mg total) by mouth daily. 90 capsule 1   No current facility-administered medications for this visit.    Allergies as of 12/28/2019  . (No Known Allergies)    Family History  Problem Relation Age of Onset  . Hyperlipidemia Mother   . Osteoporosis Mother   . Fibromyalgia Mother   . Hypertension Father   . Esophagitis Father   . Hyperlipidemia Father   . Colon polyps Father   . Hypertension Maternal Grandmother   . Diabetes Mellitus II Maternal Grandfather   . COPD Maternal Grandfather   . Hyperlipidemia Maternal Grandfather   . Hypertension Maternal Grandfather   . Hypertension Paternal Grandmother   . Hyperlipidemia Paternal Grandmother   . Hypertension Paternal Grandfather   . Stroke Paternal Grandfather   . Hyperlipidemia Paternal Grandfather   . Heart attack Paternal Grandfather   . Colon polyps Paternal Grandfather   . Pancreatic cancer Maternal Great-grandfather   . Colon cancer Neg Hx   . Esophageal cancer Neg Hx     Social History   Socioeconomic History  . Marital status: Single    Spouse name: Not on file  . Number of children: Not on file  . Years of education: Not on file  . Highest education level: Not on file  Occupational History  . Not on file  Tobacco Use  . Smoking status: Never Smoker  . Smokeless tobacco: Never Used  Vaping Use  . Vaping Use: Never used  Substance and Sexual Activity  . Alcohol use:  Never  . Drug use: Never  . Sexual activity: Not on file  Other Topics Concern  . Not on file  Social History Narrative  . Not on file   Social Determinants of Health   Financial Resource Strain:   . Difficulty of Paying Living Expenses: Not on file  Food Insecurity:   . Worried About Programme researcher, broadcasting/film/video in the Last Year: Not on file  . Ran Out of Food in the Last Year: Not on file  Transportation Needs:   . Lack of Transportation (Medical): Not on file  . Lack of Transportation (Non-Medical): Not on file  Physical  Activity:   . Days of Exercise per Week: Not on file  . Minutes of Exercise per Session: Not on file  Stress:   . Feeling of Stress : Not on file  Social Connections:   . Frequency of Communication with Friends and Family: Not on file  . Frequency of Social Gatherings with Friends and Family: Not on file  . Attends Religious Services: Not on file  . Active Member of Clubs or Organizations: Not on file  . Attends Banker Meetings: Not on file  . Marital Status: Not on file  Intimate Partner Violence:   . Fear of Current or Ex-Partner: Not on file  . Emotionally Abused: Not on file  . Physically Abused: Not on file  . Sexually Abused: Not on file     Physical Exam: BP 110/60   Pulse 71   Ht 5' 6.5" (1.689 m)   Wt 173 lb (78.5 kg)   LMP 12/12/2019   SpO2 99%   BMI 27.50 kg/m  Constitutional: generally well-appearing Psychiatric: alert and oriented x3 Eyes: extraocular movements intact Mouth: oral pharynx moist, no lesions Neck: supple no lymphadenopathy Cardiovascular: heart regular rate and rhythm Lungs: clear to auscultation bilaterally Abdomen: soft, nontender, nondistended, no obvious ascites, no peritoneal signs, normal bowel sounds Extremities: no lower extremity edema bilaterally Skin: no lesions on visible extremities   Assessment and plan: 24 y.o. female with GERD, also postprandial abdominal discomforts  Over-the-counter strength proton pump inhibitor have not helped her rather classic GERD symptoms but they are not helping her postprandial abdominal discomforts which do seem somewhat consistent with biliary colic.  I am ordering an abdominal ultrasound for her pains, if she is found to have gallstones in her gallbladder then I will likely refer her to a general surgeon to consider cholecystectomy.  If she does not have gallstones in her gallbladder then she will probably need further testing (possibly HIDA scan, possibly upper endoscopy, possibly  better imaging).     Please see the "Patient Instructions" section for addition details about the plan.   Rob Bunting, MD [facility] 12/28/2019, 10:08 AM  Cc: Janie Morning, NP  Total time on date of encounter was 45  minutes (this included time spent preparing to see the patient reviewing records; obtaining and/or reviewing separately obtained history; performing a medically appropriate exam and/or evaluation; counseling and educating the patient and family if present; ordering medications, tests or procedures if applicable; and documenting clinical information in the health record).

## 2019-12-28 NOTE — Patient Instructions (Signed)
If you are age 24 or older, your body mass index should be between 23-30. Your Body mass index is 27.5 kg/m. If this is out of the aforementioned range listed, please consider follow up with your Primary Care Provider.  If you are age 6 or younger, your body mass index should be between 19-25. Your Body mass index is 27.5 kg/m. If this is out of the aformentioned range listed, please consider follow up with your Primary Care Provider.   You have been scheduled for an abdominal ultrasound at [facility] (1st floor of hospital) on 01-04-20 at 10:30am. Please arrive 15 minutes prior to your appointment for registration. Make certain not to have anything to eat or drink 6 hours prior to your appointment. Should you need to reschedule your appointment, please contact radiology at [phone]. This test typically takes about 30 minutes to perform.  Due to recent changes in healthcare laws, you may see the results of your imaging and laboratory studies on MyChart before your provider has had a chance to review them.  We understand that in some cases there may be results that are confusing or concerning to you. Not all laboratory results come back in the same time frame and the provider may be waiting for multiple results in order to interpret others.  Please give [place] 48 hours in order for your provider to thoroughly review all the results before contacting the office for clarification of your results.   Please continue omeprazole 20mg  one capsule daily.  Thank you for entrusting me with your care and choosing [facility].  Dr [facility]

## 2019-12-30 ENCOUNTER — Ambulatory Visit: Payer: BC Managed Care – PPO | Admitting: Nurse Practitioner

## 2020-01-03 ENCOUNTER — Other Ambulatory Visit: Payer: Self-pay

## 2020-01-03 ENCOUNTER — Ambulatory Visit (HOSPITAL_COMMUNITY)
Admission: RE | Admit: 2020-01-03 | Discharge: 2020-01-03 | Disposition: A | Discharge status: Home / Self Care | Payer: BC Managed Care – PPO | Source: Ambulatory Visit | Attending: Gastroenterology | Admitting: Gastroenterology

## 2020-01-03 DIAGNOSIS — R1013 Epigastric pain: Secondary | ICD-10-CM | POA: Insufficient documentation

## 2020-01-03 DIAGNOSIS — K219 Gastro-esophageal reflux disease without esophagitis: Secondary | ICD-10-CM | POA: Insufficient documentation

## 2020-01-04 ENCOUNTER — Ambulatory Visit (HOSPITAL_COMMUNITY): Payer: BC Managed Care – PPO

## 2020-01-05 ENCOUNTER — Other Ambulatory Visit: Payer: BC Managed Care – PPO

## 2020-01-05 ENCOUNTER — Telehealth: Payer: Self-pay | Admitting: Gastroenterology

## 2020-01-05 ENCOUNTER — Other Ambulatory Visit: Payer: Self-pay

## 2020-01-05 ENCOUNTER — Telehealth: Payer: Self-pay

## 2020-01-05 DIAGNOSIS — K219 Gastro-esophageal reflux disease without esophagitis: Secondary | ICD-10-CM

## 2020-01-05 DIAGNOSIS — R197 Diarrhea, unspecified: Secondary | ICD-10-CM

## 2020-01-05 DIAGNOSIS — R109 Unspecified abdominal pain: Secondary | ICD-10-CM

## 2020-01-05 MED ORDER — OMEPRAZOLE 40 MG PO CPDR: 40.0000 mg | DELAYED_RELEASE_CAPSULE | Freq: Every day | ORAL | 3 refills | Status: DC

## 2020-01-05 NOTE — Telephone Encounter (Signed)
Cassidy Fee, MD  Loretha Stapler, RN  Cassidy Shaffer,  See below, she needs omeprazole 40 mg call in 30 pills with 11 refills. She also needs upper endoscopy for postprandial epigastric pain. Thank you     Your ultrasound was completely normal. I think the next step is upper endoscopy and I would also like to increase your antiacid medicine to a prescription strength. We will be calling in omeprazole 40 mg pills, take 1 pill once daily shortly before your breakfast meal every day. My office will also contact you about scheduling the upper endoscopy in the next few weeks. Thank you

## 2020-01-05 NOTE — Telephone Encounter (Signed)
CT scan 01/19/20 at [facility] at 12 noon will need to pick up contrast and instructions

## 2020-01-05 NOTE — Telephone Encounter (Signed)
Tobi Bastos from [facility] is requesting for the pt's CT scan be scheduled at [facility] in order to avoid out of pocket expenses for the pt if possible. Caller would like to know if the CT scan order could be faxed over to them.  CB [phone] Fax: [phone]

## 2020-01-05 NOTE — Telephone Encounter (Signed)
Left message on machine to call back  

## 2020-01-05 NOTE — Telephone Encounter (Signed)
Cassidy Shaffer,  Given changes in her symptoms lets get CT scan abd/pelvis with IV and oral contrast AND also GI pathogen panel (there has been a salmonella outbreak involving the hospital cafeteria I have heard).   Message text   You routed conversation to Rachael Fee, MD 8 minutes ago (8:59 AM)  Conan Bowens, MD 26 minutes ago (8:40 AM)  HS Starting last Wednesday, my abdominal pain has been widespread..not just epigastric. It has been hurting the worst around my belly button (sometimes radiating to RLQ) and on the entire left side of my abdomen (upper and lower), in addition to the epigastric and back pain I had previously. Sometimes it is a dull ache across my entire abdomen, other times it is sharp pain in a certain area (location changes throughout the day). This is accompanied by occasional nausea and I have noticed consistently loose stools, some of which are light in color. It has hurt almost nonstop for the past 5 days (not just after eating, and typically not worse after eating) without relief and wakes me up at night. Would it be beneficial to do a CT scan or the HIDA scan you mentioned before an upper endoscopy, given the widespread abdominal pain that now extends into my lower abdomen? Or would an upper endoscopy still give you the best information? With my insurance, having a CT scan does not cost me anything with a doctor's order if I schedule it through [facility].

## 2020-01-05 NOTE — Telephone Encounter (Signed)
I spoke with the pt and she will come today and pick up stool kit. She also confirmed that her CT scan is being set up by her insurance company

## 2020-01-05 NOTE — Telephone Encounter (Signed)
The CT order was faxed about an hour ago.  Cassidy Shaffer has been advised

## 2020-01-05 NOTE — Telephone Encounter (Signed)
Order for CT scan has been faxed to the facility requested.  Call placed to pt to make her aware and to have her come in for stool test.

## 2020-01-11 ENCOUNTER — Other Ambulatory Visit: Payer: BC Managed Care – PPO

## 2020-01-11 ENCOUNTER — Ambulatory Visit: Payer: BC Managed Care – PPO | Admitting: Nurse Practitioner

## 2020-01-11 ENCOUNTER — Encounter: Payer: Self-pay | Admitting: Nurse Practitioner

## 2020-01-11 ENCOUNTER — Other Ambulatory Visit: Payer: Self-pay

## 2020-01-11 VITALS — BP 118/62 | HR 86 | Temp 98.0°F | Ht 67.0 in | Wt 172.0 lb

## 2020-01-11 DIAGNOSIS — R197 Diarrhea, unspecified: Secondary | ICD-10-CM

## 2020-01-11 DIAGNOSIS — K219 Gastro-esophageal reflux disease without esophagitis: Secondary | ICD-10-CM

## 2020-01-11 NOTE — Patient Instructions (Addendum)
Continue GERD precautions Follow-up in 1 year or sooner as needed   Omeprazole tablets (OTC) What is this medicine? OMEPRAZOLE (oh ME pray zol) prevents the production of acid in the stomach. It is used to treat the symptoms of heartburn. You can buy this medicine without a prescription. This product is not for long-term use, unless otherwise directed by your doctor or health care professional. This medicine may be used for other purposes; ask your health care provider or pharmacist if you have questions. COMMON BRAND NAME(S): Prilosec OTC What should I tell my health care provider before I take this medicine? They need to know if you have any of these conditions:  black or bloody stools  chest pain  difficulty swallowing  have had heartburn for over 3 months  have heartburn with dizziness, lightheadedness or sweating  liver disease  lupus  stomach pain  unexplained weight loss  vomiting with blood  wheezing  an unusual or allergic reaction to omeprazole, other medicines, foods, dyes, or preservatives  pregnant or trying to get pregnant  breast-feeding How should I use this medicine? Take this medicine by mouth with a glass of water. Follow the directions on the product label. Do not cut, crush or chew this medicine. Swallow the tablets whole. Take this medicine on an empty stomach, at least 30 minutes before breakfast. Take your medicine at regular intervals. Do not take it more often than directed. Talk to your pediatrician regarding the use of this medicine in children. Special care may be needed. Overdosage: If you think you have taken too much of this medicine contact a poison control center or emergency room at once. NOTE: This medicine is only for you. Do not share this medicine with others. What if I miss a dose? If you miss a dose, take it as soon as you can. If it is almost time for your next dose, take only that dose. Do not take double or extra doses. What may  interact with this medicine? Do not take this medicine with any of the following medications:  atazanavir  clopidogrel  nelfinavir  rilpivirine This medicine may also interact with the following medications:  antifungals like itraconazole, ketoconazole, and voriconazole  certain antivirals for HIV or hepatitis  certain medicines that treat or prevent blood clots like warfarin  cilostazol  citalopram  cyclosporine  dasatinib  digoxin  disulfiram  diuretics  erlotinib  iron supplements  medicines for anxiety, panic, and sleep like diazepam  medicines for seizures like carbamazepine, phenobarbital, phenytoin  methotrexate  mycophenolate mofetil  nilotinib  rifampin  St. John's wort  tacrolimus  vitamin B12 This list may not describe all possible interactions. Give your health care provider a list of all the medicines, herbs, non-prescription drugs, or dietary supplements you use. Also tell them if you smoke, drink alcohol, or use illegal drugs. Some items may interact with your medicine. What should I watch for while using this medicine? It can take several days before your heartburn gets better. Tell your healthcare professional if your symptoms do not start to get better or if they get worse. If you need to take this medicine for more than 14 days, talk to your healthcare professional. Heartburn may sometimes be caused by a more serious condition. This medicine may cause a decrease in vitamin B12. You should make sure that you get enough vitamin B12 while you are taking this medicine. Discuss the foods you eat and the vitamins you take with your health care professional.  What side effects may I notice from receiving this medicine? Side effects that you should report to your doctor or health care professional as soon as possible:  allergic reactions like skin rash, itching or hives, swelling of the face, lips, or tongue  bone pain  breathing  problems  fever or sore throat  joint pain  rash on cheeks or arms that gets worse in the sun  redness, blistering, peeling, or loosening of the skin, including inside the mouth  severe diarrhea  signs and symptoms of kidney injury like trouble passing urine or change in the amount of urine  signs and symptoms of low magnesium like muscle cramps; muscle pain; muscle weakness; tremors; seizures; or fast, irregular heartbeat  stomach polyps  unusual bleeding or bruising Side effects that usually do not require medical attention (report to your doctor or health care professional if they continue or are bothersome):  diarrhea  dry mouth  gas  headache  nausea  stomach pain This list may not describe all possible side effects. Call your doctor for medical advice about side effects. You may report side effects to FDA at 1-800-FDA-1088. Where should I keep my medicine? Keep out of the reach of children. Store at room temperature between 20 and 25 degrees C (68 and 77 degrees F). Protect from light and moisture. Throw away any unused medicine after the expiration date. NOTE: This sheet is a summary. It may not cover all possible information. If you have questions about this medicine, talk to your doctor, pharmacist, or health care provider.  2020 Elsevier/Gold Standard (2017-11-19 13:06:30) Gastroesophageal Reflux Disease, Adult Gastroesophageal reflux (GER) happens when acid from the stomach flows up into the tube that connects the mouth and the stomach (esophagus). Normally, food travels down the esophagus and stays in the stomach to be digested. With GER, food and stomach acid sometimes move back up into the esophagus. You may have a disease called gastroesophageal reflux disease (GERD) if the reflux:  Happens often.  Causes frequent or very bad symptoms.  Causes problems such as damage to the esophagus. When this happens, the esophagus becomes sore and swollen (inflamed).  Over time, GERD can make small holes (ulcers) in the lining of the esophagus. What are the causes? This condition is caused by a problem with the muscle between the esophagus and the stomach. When this muscle is weak or not normal, it does not close properly to keep food and acid from coming back up from the stomach. The muscle can be weak because of:  Tobacco use.  Pregnancy.  Having a certain type of hernia (hiatal hernia).  Alcohol use.  Certain foods and drinks, such as coffee, chocolate, onions, and peppermint. What increases the risk? You are more likely to develop this condition if you:  Are overweight.  Have a disease that affects your connective tissue.  Use NSAID medicines. What are the signs or symptoms? Symptoms of this condition include:  Heartburn.  Difficult or painful swallowing.  The feeling of having a lump in the throat.  A bitter taste in the mouth.  Bad breath.  Having a lot of saliva.  Having an upset or bloated stomach.  Belching.  Chest pain. Different conditions can cause chest pain. Make sure you see your doctor if you have chest pain.  Shortness of breath or noisy breathing (wheezing).  Ongoing (chronic) cough or a cough at night.  Wearing away of the surface of teeth (tooth enamel).  Weight loss. How is this  treated? Treatment will depend on how bad your symptoms are. Your doctor may suggest:  Changes to your diet.  Medicine.  Surgery. Follow these instructions at home: Eating and drinking   Follow a diet as told by your doctor. You may need to avoid foods and drinks such as: ? Coffee and tea (with or without caffeine). ? Drinks that contain alcohol. ? Energy drinks and sports drinks. ? Bubbly (carbonated) drinks or sodas. ? Chocolate and cocoa. ? Peppermint and mint flavorings. ? Garlic and onions. ? Horseradish. ? Spicy and acidic foods. These include peppers, chili powder, curry powder, vinegar, hot sauces, and BBQ  sauce. ? Citrus fruit juices and citrus fruits, such as oranges, lemons, and limes. ? Tomato-based foods. These include red sauce, chili, salsa, and pizza with red sauce. ? Fried and fatty foods. These include donuts, french fries, potato chips, and high-fat dressings. ? High-fat meats. These include hot dogs, rib eye steak, sausage, ham, and bacon. ? High-fat dairy items, such as whole milk, butter, and cream cheese.  Eat small meals often. Avoid eating large meals.  Avoid drinking large amounts of liquid with your meals.  Avoid eating meals during the 2-3 hours before bedtime.  Avoid lying down right after you eat.  Do not exercise right after you eat. Lifestyle   Do not use any products that contain nicotine or tobacco. These include cigarettes, e-cigarettes, and chewing tobacco. If you need help quitting, ask your doctor.  Try to lower your stress. If you need help doing this, ask your doctor.  If you are overweight, lose an amount of weight that is healthy for you. Ask your doctor about a safe weight loss goal. General instructions  Pay attention to any changes in your symptoms.  Take over-the-counter and prescription medicines only as told by your doctor. Do not take aspirin, ibuprofen, or other NSAIDs unless your doctor says it is okay.  Wear loose clothes. Do not wear anything tight around your waist.  Raise (elevate) the head of your bed about 6 inches (15 cm).  Avoid bending over if this makes your symptoms worse.  Keep all follow-up visits as told by your doctor. This is important. Contact a doctor if:  You have new symptoms.  You lose weight and you do not know why.  You have trouble swallowing or it hurts to swallow.  You have wheezing or a cough that keeps happening.  Your symptoms do not get better with treatment.  You have a hoarse voice. Get help right away if:  You have pain in your arms, neck, jaw, teeth, or back.  You feel sweaty, dizzy, or  light-headed.  You have chest pain or shortness of breath.  You throw up (vomit) and your throw-up looks like blood or coffee grounds.  You pass out (faint).  Your poop (stool) is bloody or black.  You cannot swallow, drink, or eat. Summary  If a person has gastroesophageal reflux disease (GERD), food and stomach acid move back up into the esophagus and cause symptoms or problems such as damage to the esophagus.  Treatment will depend on how bad your symptoms are.  Follow a diet as told by your doctor.  Take all medicines only as told by your doctor. This information is not intended to replace advice given to you by your health care provider. Make sure you discuss any questions you have with your health care provider. Document Revised: 08/06/2017 Document Reviewed: 08/06/2017 Elsevier Patient Education  2020 [employer].  Food Choices for Gastroesophageal Reflux Disease, Adult When you have gastroesophageal reflux disease (GERD), the foods you eat and your eating habits are very important. Choosing the right foods can help ease your discomfort. Think about working with a nutrition specialist (dietitian) to help you make good choices. What are tips for following this plan?  Meals  Choose healthy foods that are low in fat, such as fruits, vegetables, whole grains, low-fat dairy products, and lean meat, fish, and poultry.  Eat small meals often instead of 3 large meals a day. Eat your meals slowly, and in a place where you are relaxed. Avoid bending over or lying down until 2-3 hours after eating.  Avoid eating meals 2-3 hours before bed.  Avoid drinking a lot of liquid with meals.  Cook foods using methods other than frying. Bake, grill, or broil food instead.  Avoid or limit: ? Chocolate. ? Peppermint or spearmint. ? Alcohol. ? Pepper. ? Black and decaffeinated coffee. ? Black and decaffeinated tea. ? Bubbly (carbonated) soft drinks. ? Caffeinated energy drinks and soft  drinks.  Limit high-fat foods such as: ? Fatty meat or fried foods. ? Whole milk, cream, butter, or ice cream. ? Nuts and nut butters. ? Pastries, donuts, and sweets made with butter or shortening.  Avoid foods that cause symptoms. These foods may be different for everyone. Common foods that cause symptoms include: ? Tomatoes. ? Oranges, lemons, and limes. ? Peppers. ? Spicy food. ? Onions and garlic. ? Vinegar. Lifestyle  Maintain a healthy weight. Ask your doctor what weight is healthy for you. If you need to lose weight, work with your doctor to do so safely.  Exercise for at least 30 minutes for 5 or more days each week, or as told by your doctor.  Wear loose-fitting clothes.  Do not smoke. If you need help quitting, ask your doctor.  Sleep with the head of your bed higher than your feet. Use a wedge under the mattress or blocks under the bed frame to raise the head of the bed. Summary  When you have gastroesophageal reflux disease (GERD), food and lifestyle choices are very important in easing your symptoms.  Eat small meals often instead of 3 large meals a day. Eat your meals slowly, and in a place where you are relaxed.  Limit high-fat foods such as fatty meat or fried foods.  Avoid bending over or lying down until 2-3 hours after eating.  Avoid peppermint and spearmint, caffeine, alcohol, and chocolate. This information is not intended to replace advice given to you by your health care provider. Make sure you discuss any questions you have with your health care provider. Document Revised: 05/21/2018 Document Reviewed: 03/05/2016 Elsevier Patient Education  2020 [employer]

## 2020-01-11 NOTE — Progress Notes (Signed)
Subjective:  Patient ID: Cassidy Shaffer, female    DOB: 1995-10-05  Age: 24 y.o. MRN: [id number]  Chief Complaint  Patient presents with   Gastroesophageal Reflux    1 month follow up    HPI  Cassidy Shaffer is a 24 year old Caucasian female that presents for follow-up of GERD. She was seen by GI, Dr Christella Hartigan, on 12-28-19. The patient tells me her GERD symptoms have improved since Dr Christella Hartigan increased her Omeprazole to 40 mg daily. She had an abd [place] on 01-04-20 which was normal. She is scheduled to follow-up with Dr Christella Hartigan for an upper endoscopy and has abd CT scheduled on 01/19/20.    Current Outpatient Medications on File Prior to Visit  Medication Sig Dispense Refill   omeprazole (PRILOSEC) 40 MG capsule Take 1 capsule (40 mg total) by mouth daily. 90 capsule 3   No current facility-administered medications on file prior to visit.   Past Medical History:  Diagnosis Date   GERD (gastroesophageal reflux disease)    Past Surgical History:  Procedure Laterality Date   WISDOM TOOTH EXTRACTION      Family History  Problem Relation Age of Onset   Hyperlipidemia Mother    Osteoporosis Mother    Fibromyalgia Mother    Hypertension Father    Esophagitis Father    Hyperlipidemia Father    Colon polyps Father    Hypertension Maternal Grandmother    Diabetes Mellitus II Maternal Grandfather    COPD Maternal Grandfather    Hyperlipidemia Maternal Grandfather    Hypertension Maternal Grandfather    Hypertension Paternal Grandmother    Hyperlipidemia Paternal Grandmother    Hypertension Paternal Grandfather    Stroke Paternal Grandfather    Hyperlipidemia Paternal Grandfather    Heart attack Paternal Grandfather    Colon polyps Paternal Grandfather    Pancreatic cancer Maternal Great-grandfather    Colon cancer Neg Hx    Esophageal cancer Neg Hx    Social History   Socioeconomic History   Marital status: Single    Spouse name: Not on file   Number of  children: Not on file   Years of education: Not on file   Highest education level: Not on file  Occupational History   Not on file  Tobacco Use   Smoking status: Never Smoker   Smokeless tobacco: Never Used  Vaping Use   Vaping Use: Never used  Substance and Sexual Activity   Alcohol use: Never   Drug use: Never   Sexual activity: Not on file  Other Topics Concern   Not on file  Social History Narrative   Not on file   Social Determinants of Health   Financial Resource Strain:    Difficulty of Paying Living Expenses: Not on file  Food Insecurity:    Worried About Programme researcher, broadcasting/film/video in the Last Year: Not on file   [employer] of Food in the Last Year: Not on file  Transportation Needs:    Lack of Transportation (Medical): Not on file   Lack of Transportation (Non-Medical): Not on file  Physical Activity:    Days of Exercise per Week: Not on file   Minutes of Exercise per Session: Not on file  Stress:    Feeling of Stress : Not on file  Social Connections:    Frequency of Communication with Friends and Family: Not on file   Frequency of Social Gatherings with Friends and Family: Not on file   Attends Religious Services: Not on file  Active Member of Clubs or Organizations: Not on file   Attends Banker Meetings: Not on file   Marital Status: Not on file    Review of Systems  Constitutional: Negative for fatigue and fever.  HENT: Negative for congestion, ear pain, sinus pressure and sore throat.   Eyes: Negative for pain.  Respiratory: Negative for cough, chest tightness, shortness of breath and wheezing.   Cardiovascular: Negative for chest pain and palpitations.  Gastrointestinal: Negative for abdominal pain, constipation, diarrhea, nausea and vomiting.  Genitourinary: Negative for dysuria and hematuria.  Musculoskeletal: Negative for arthralgias, back pain, joint swelling and myalgias.  Skin: Negative for rash.  Neurological:  Negative for dizziness, weakness and headaches.  Psychiatric/Behavioral: Negative for dysphoric mood. The patient is not nervous/anxious.      Objective:  BP 118/62 (BP Location: Left Arm, Patient Position: Sitting)    Pulse 86    Temp 98 F (36.7 C) (Temporal)    Ht 5\' 7"  (1.702 m)    Wt 172 lb (78 kg)    LMP 12/12/2019    SpO2 99%    BMI 26.94 kg/m   BP/Weight 01/11/2020 12/28/2019 12/01/2019  Systolic BP 118 110 128  Diastolic BP 62 60 78  Wt. (Lbs) 172 173 156  BMI 26.94 27.5 24.8    Physical Exam Vitals reviewed.  Constitutional:      Appearance: Normal appearance.  HENT:     Head: Normocephalic.     Right Ear: Tympanic membrane, ear canal and external ear normal.     Left Ear: Tympanic membrane, ear canal and external ear normal.     Mouth/Throat:     Mouth: Mucous membranes are moist.  Cardiovascular:     Rate and Rhythm: Normal rate and regular rhythm.     Pulses: Normal pulses.     Heart sounds: Normal heart sounds.  Pulmonary:     Effort: Pulmonary effort is normal.     Breath sounds: Normal breath sounds.  Abdominal:     General: Bowel sounds are normal.     Palpations: Abdomen is soft.  Musculoskeletal:        General: Normal range of motion.     Cervical back: Normal range of motion.  Skin:    General: Skin is warm and dry.     Capillary Refill: Capillary refill takes less than 2 seconds.  Neurological:     General: No focal deficit present.     Mental Status: She is alert and oriented to person, place, and time.  Psychiatric:        Mood and Affect: Mood normal.        Behavior: Behavior normal.        Thought Content: Thought content normal.        Judgment: Judgment normal.     Diabetic Foot Exam - Simple   No data filed       Lab Results  Component Value Date   WBC 7.7 12/01/2019   HGB 12.5 12/01/2019   HCT 37.8 12/01/2019   PLT 294 12/01/2019   GLUCOSE 80 12/01/2019   ALT 15 12/01/2019   AST 16 12/01/2019   NA 138 12/01/2019   K  4.1 12/01/2019   CL 104 12/01/2019   CREATININE 0.86 12/01/2019   BUN 11 12/01/2019   CO2 28 12/01/2019      Assessment & Plan:   1. Gastroesophageal reflux disease without esophagitis   -Continue Omeprazole 40 mg daily -Continue GERD precautions  Continue GERD precautions Follow-up in 1 year or sooner as needed    Follow-up: 1 year or sooner as needed  An After Visit Summary was printed and given to the patient.  Janie Morning, NP [facility] [phone]

## 2020-01-13 LAB — GI PROFILE, STOOL, PCR

## 2020-01-19 ENCOUNTER — Ambulatory Visit (HOSPITAL_COMMUNITY): Payer: BC Managed Care – PPO

## 2020-01-19 ENCOUNTER — Telehealth: Payer: Self-pay

## 2020-01-19 NOTE — Telephone Encounter (Signed)
Lm on vm for patient to return call to schedule EGD with Dr. Christella Hartigan in the [facility].

## 2020-01-19 NOTE — Telephone Encounter (Signed)
-----   Message from Loretha Stapler, RN sent at 01/05/2020 12:20 PM EST ----- Needs egd

## 2020-01-20 NOTE — Telephone Encounter (Signed)
Patient has been scheduled for a pre-visit on 03/08/2020 at 9 AM.   EGD scheduled with Dr. Christella Hartigan on 03/20/2020 at 9:30 AM.

## 2020-03-08 ENCOUNTER — Ambulatory Visit (AMBULATORY_SURGERY_CENTER): Payer: Self-pay | Admitting: *Deleted

## 2020-03-08 ENCOUNTER — Other Ambulatory Visit: Payer: Self-pay

## 2020-03-08 VITALS — Ht 67.0 in | Wt 170.8 lb

## 2020-03-08 DIAGNOSIS — K219 Gastro-esophageal reflux disease without esophagitis: Secondary | ICD-10-CM

## 2020-03-08 DIAGNOSIS — R1013 Epigastric pain: Secondary | ICD-10-CM

## 2020-03-08 NOTE — Progress Notes (Signed)

## 2020-03-15 ENCOUNTER — Encounter: Payer: Self-pay | Admitting: Gastroenterology

## 2020-03-20 ENCOUNTER — Other Ambulatory Visit: Payer: Self-pay

## 2020-03-20 ENCOUNTER — Encounter: Payer: Self-pay | Admitting: Gastroenterology

## 2020-03-20 ENCOUNTER — Ambulatory Visit (AMBULATORY_SURGERY_CENTER): Payer: BC Managed Care – PPO | Admitting: Gastroenterology

## 2020-03-20 VITALS — BP 115/71 | HR 66 | Temp 98.3°F | Resp 16 | Ht 67.0 in | Wt 170.8 lb

## 2020-03-20 DIAGNOSIS — K219 Gastro-esophageal reflux disease without esophagitis: Secondary | ICD-10-CM

## 2020-03-20 DIAGNOSIS — K449 Diaphragmatic hernia without obstruction or gangrene: Secondary | ICD-10-CM

## 2020-03-20 DIAGNOSIS — R1013 Epigastric pain: Secondary | ICD-10-CM | POA: Diagnosis present

## 2020-03-20 DIAGNOSIS — R12 Heartburn: Secondary | ICD-10-CM | POA: Diagnosis not present

## 2020-03-20 DIAGNOSIS — K2950 Unspecified chronic gastritis without bleeding: Secondary | ICD-10-CM | POA: Diagnosis not present

## 2020-03-20 DIAGNOSIS — K2990 Gastroduodenitis, unspecified, without bleeding: Secondary | ICD-10-CM

## 2020-03-20 DIAGNOSIS — K2970 Gastritis, unspecified, without bleeding: Secondary | ICD-10-CM

## 2020-03-20 MED ORDER — SODIUM CHLORIDE 0.9 % IV SOLN: 500.0000 mL | Freq: Once | INTRAVENOUS | Status: DC

## 2020-03-20 NOTE — Op Note (Signed)
[facility] Patient Name: Cassidy Shaffer Procedure Date: 03/20/2020 8:44 AM MRN: [id number] Endoscopist: Rachael Fee , MD Age: 25 Referring MD:  Date of Birth: [birth year] Gender: Female Account #: [id number] Procedure:                Upper GI endoscopy Indications:              Generalized abdominal pain, Heartburn Medicines:                Monitored Anesthesia Care Procedure:                Pre-Anesthesia Assessment:                           - Prior to the procedure, a History and Physical                            was performed, and patient medications and                            allergies were reviewed. The patient's tolerance of                            previous anesthesia was also reviewed. The risks                            and benefits of the procedure and the sedation                            options and risks were discussed with the patient.                            All questions were answered, and informed consent                            was obtained. Prior Anticoagulants: The patient has                            taken no previous anticoagulant or antiplatelet                            agents. ASA Grade Assessment: I - A normal, healthy                            patient. After reviewing the risks and benefits,                            the patient was deemed in satisfactory condition to                            undergo the procedure.                           After obtaining informed consent, the endoscope was  passed under direct vision. Throughout the                            procedure, the patient's blood pressure, pulse, and                            oxygen saturations were monitored continuously. The                            Endoscope was introduced through the mouth, and                            advanced to the second part of duodenum. The upper                            GI endoscopy was accomplished  without difficulty.                            The patient tolerated the procedure well. Scope In: Scope Out: Findings:                 Minimal inflammation characterized by granularity                            was found in the gastric antrum. Biopsies were                            taken with a cold forceps for histology.                           The exam was otherwise without abnormality. Complications:            No immediate complications. Estimated blood loss:                            None. Estimated Blood Loss:     Estimated blood loss: none. Impression:               - Minimal gastritis, biopsied to check for H.                            pylori.                           - The examination was otherwise normal. Recommendation:           - Patient has a contact number available for                            emergencies. The signs and symptoms of potential                            delayed complications were discussed with the                            patient. Return to normal activities tomorrow.  Written discharge instructions were provided to the                            patient.                           - Resume previous diet.                           - Continue present medications. Please resume your                            omeprazole 40mg  pills, one pill before breakfast                            every day. It seems to be helping your symptoms                            quite well.                           - Await pathology results. , MD 03/20/2020 9:11:47 AM This report has been signed electronically.

## 2020-03-20 NOTE — Progress Notes (Signed)
Called to room to assist during endoscopic procedure.  Patient ID and intended procedure confirmed with present staff. Received instructions for my participation in the procedure from the performing physician.  

## 2020-03-20 NOTE — Patient Instructions (Addendum)
Avoid  NSAIDS (Non-Steroidal anti-inflammatory drugs).  (These include, aspirin, aspirin-containing products, ibuprofen, advil, motrin, naproxen, aleve, goody powders, etc) Tylenol is ok to take as needed, see label for instructions.  Handout given for gastritis.   YOU HAD AN ENDOSCOPIC PROCEDURE TODAY AT THE [facility]:   Refer to the procedure report that was given to you for any specific questions about what was found during the examination.  If the procedure report does not answer your questions, please call your gastroenterologist to clarify.  If you requested that your care partner not be given the details of your procedure findings, then the procedure report has been included in a sealed envelope for you to review at your convenience later.  YOU SHOULD EXPECT: Some feelings of bloating in the abdomen. Passage of more gas than usual.  Walking can help get rid of the air that was put into your GI tract during the procedure and reduce the bloating. If you had a lower endoscopy (such as a colonoscopy or flexible sigmoidoscopy) you may notice spotting of blood in your stool or on the toilet paper. If you underwent a bowel prep for your procedure, you may not have a normal bowel movement for a few days.  Please Note:  You might notice some irritation and congestion in your nose or some drainage.  This is from the oxygen used during your procedure.  There is no need for concern and it should clear up in a day or so.  SYMPTOMS TO REPORT IMMEDIATELY:   Following upper endoscopy (EGD)  Vomiting of blood or coffee ground material  New chest pain or pain under the shoulder blades  Painful or persistently difficult swallowing  New shortness of breath  Fever of 100F or higher  Black, tarry-looking stools  For urgent or emergent issues, a gastroenterologist can be reached at any hour by calling [phone]. Do not use MyChart messaging for urgent concerns.    DIET:  We do  recommend a small meal at first, but then you may proceed to your regular diet.  Drink plenty of fluids but you should avoid alcoholic beverages for 24 hours.  ACTIVITY:  You should plan to take it easy for the rest of today and you should NOT DRIVE, No Work or use heavy machinery until tomorrow (because of the sedation medicines used during the test).    FOLLOW UP: Our staff will call the number listed on your records Wednesday around 715 am - 8 am following your procedure to check on you and address any questions or concerns that you may have regarding the information given to you following your procedure. If we do not reach you, we will leave a message.  We will attempt to reach you two times.  During this call, we will ask if you have developed any symptoms of COVID 19. If you develop any symptoms (ie: fever, flu-like symptoms, shortness of breath, cough etc.) before then, please call [phone].  If you test positive for Covid 19 in the 2 weeks post procedure, please call and report this information to [place].    If any biopsies were taken you will be contacted by phone or by letter within the next 1-3 weeks.  Please call us at [phone] if you have not heard about the biopsies in 3 weeks.    SIGNATURES/CONFIDENTIALITY: You and/or your care partner have signed paperwork which will be entered into your electronic medical record.  These signatures attest to the fact that  that the information above on your After Visit Summary has been reviewed and is understood.  Full responsibility of the confidentiality of this discharge information lies with you and/or your care-partner.

## 2020-03-20 NOTE — Progress Notes (Signed)
JB - Check-in  CW - VS  Pt's states no medical or surgical changes since previsit or office visit. 

## 2020-03-20 NOTE — Progress Notes (Signed)
PT taken to PACU. Monitors in place. VSS. Report given to RN. 

## 2020-03-22 ENCOUNTER — Telehealth: Payer: Self-pay

## 2020-03-22 NOTE — Telephone Encounter (Signed)
Second post procedure follow up call, no answer 

## 2020-03-22 NOTE — Telephone Encounter (Signed)
First post procedure follow up call, no answer 

## 2020-05-30 ENCOUNTER — Encounter: Payer: Self-pay | Admitting: Nurse Practitioner

## 2020-05-30 ENCOUNTER — Other Ambulatory Visit: Payer: Self-pay

## 2020-05-30 ENCOUNTER — Ambulatory Visit: Payer: BC Managed Care – PPO | Admitting: Nurse Practitioner

## 2020-05-30 VITALS — BP 142/92 | HR 71 | Temp 97.8°F | Ht 67.0 in | Wt 173.0 lb

## 2020-05-30 DIAGNOSIS — R030 Elevated blood-pressure reading, without diagnosis of hypertension: Secondary | ICD-10-CM

## 2020-05-30 DIAGNOSIS — R5383 Other fatigue: Secondary | ICD-10-CM

## 2020-05-30 DIAGNOSIS — M545 Low back pain, unspecified: Secondary | ICD-10-CM | POA: Diagnosis not present

## 2020-05-30 DIAGNOSIS — M898X9 Other specified disorders of bone, unspecified site: Secondary | ICD-10-CM | POA: Diagnosis not present

## 2020-05-30 LAB — POCT URINALYSIS DIPSTICK
Bilirubin, UA: NEGATIVE
Glucose, UA: NEGATIVE
Ketones, UA: NEGATIVE
Leukocytes, UA: NEGATIVE
Nitrite, UA: NEGATIVE
Protein, UA: NEGATIVE
Spec Grav, UA: 1.025 (ref 1.010–1.025)
Urobilinogen, UA: NEGATIVE E.U./dL — AB
pH, UA: 6 (ref 5.0–8.0)

## 2020-05-30 MED ORDER — TRIAMCINOLONE ACETONIDE 40 MG/ML IJ SUSP
60.0000 mg | Freq: Once | INTRAMUSCULAR | Status: AC
  Administered 2020-05-30: 60 mg via INTRAMUSCULAR

## 2020-05-30 MED ORDER — KETOROLAC TROMETHAMINE 60 MG/2ML IM SOLN
60.0000 mg | Freq: Once | INTRAMUSCULAR | Status: AC
  Administered 2020-05-30: 60 mg via INTRAMUSCULAR

## 2020-05-30 NOTE — Progress Notes (Signed)
Acute Office Visit  Subjective:    Patient ID: Cassidy Shaffer, female    DOB: [birth year], 25 y.o.   MRN: [id number]  Chief Complaint  Patient presents with  . Low back pain        HPI Cassidy Shaffer is a 25 year old Caucasian female that presents for evaluation of acute lower-mid-back pain. States symptoms began 3-days ago while bending over at the grocery store. Describes pain as stabbing, knife-like in the center of lower lumbar area. Pain radiates to the left slightly but not inferiorly. Denies numbness or tingling to bilateral lower extremities or changes to bowel or bladder continence. Denies UTI symptoms. States she did experience mild bilateral lower extremity weakness the following day. Certain movements aggravate pain. Treatments have included rest, alternating heat/ice, and Ibuprofen. She works full-time as a Designer, jewellery at [employer]. Denies previous back injury, MVA, or surgery. She does recall that she stood up while going down a sliding board in the 5th or 6th grade that caused some back discomfort.    Cassidy Shaffer states she has experienced generalized fatigue and bone pain. She states she is exercising "some, but not as often as I should."   Cassidy Shaffer's BP is elevated today in office 142/92. Medical record review reveals she had a previous diagnosis of hypertension during nursing school. She does not currently take medications for hypertension.  Past Medical History:  Diagnosis Date  . Allergy   . Epigastric pain   . GERD (gastroesophageal reflux disease)   . Hyperlipidemia    borderline   . Hypertension    past hx in nursing school     Past Surgical History:  Procedure Laterality Date  . WISDOM TOOTH EXTRACTION      Family History  Problem Relation Age of Onset  . Hyperlipidemia Mother   . Osteoporosis Mother   . Fibromyalgia Mother   . Hypertension Father   . Esophagitis Father   . Hyperlipidemia Father   . Colon polyps Father   . Hypertension Maternal  Grandmother   . Diabetes Mellitus II Maternal Grandfather   . COPD Maternal Grandfather   . Hyperlipidemia Maternal Grandfather   . Hypertension Maternal Grandfather   . Hypertension Paternal Grandmother   . Hyperlipidemia Paternal Grandmother   . Hypertension Paternal Grandfather   . Stroke Paternal Grandfather   . Hyperlipidemia Paternal Grandfather   . Heart attack Paternal Grandfather   . Colon polyps Paternal Grandfather   . Pancreatic cancer Maternal Great-grandfather   . Colon cancer Neg Hx   . Esophageal cancer Neg Hx   . Rectal cancer Neg Hx   . Stomach cancer Neg Hx     Social History   Socioeconomic History  . Marital status: Single    Spouse name: Not on file  . Number of children: Not on file  . Years of education: Not on file  . Highest education level: Not on file  Occupational History  . Not on file  Tobacco Use  . Smoking status: Never Smoker  . Smokeless tobacco: Never Used  Vaping Use  . Vaping Use: Never used  Substance and Sexual Activity  . Alcohol use: Never  . Drug use: Never  . Sexual activity: Never  Other Topics Concern  . Not on file  Social History Narrative  . Not on file     Outpatient Medications Prior to Visit  Medication Sig Dispense Refill  . omeprazole (PRILOSEC) 20 MG capsule Take 1 capsule by mouth daily.    Marland Kitchen  omeprazole (PRILOSEC) 40 MG capsule Take 1 capsule (40 mg total) by mouth daily. (Patient taking differently: Take 40 mg by mouth daily. 20- 30 minutes before  breakfast) 90 capsule 3   No facility-administered medications prior to visit.    No Known Allergies  Review of Systems  Constitutional: Positive for fatigue.  Eyes: Negative.   Respiratory: Negative.   Cardiovascular: Negative.   Gastrointestinal: Negative.   Endocrine: Negative.   Genitourinary: Negative for decreased urine volume, dysuria, flank pain, frequency, hematuria, menstrual problem and urgency.  Musculoskeletal: Positive for arthralgias and  back pain. Negative for joint swelling and myalgias.  Skin: Negative.   Allergic/Immunologic: Negative.   Neurological: Negative.   Hematological: Negative.   Psychiatric/Behavioral: Negative.        Objective:    Physical Exam Vitals reviewed.  Constitutional:      Appearance: Normal appearance.  HENT:     Head: Normocephalic.  Pulmonary:     Effort: Pulmonary effort is normal.  Musculoskeletal:        General: Tenderness present.     Lumbar back: Tenderness present. No swelling or edema. Decreased range of motion. Negative right straight leg raise test and negative left straight leg raise test.       Back:  Skin:    General: Skin is warm and dry.     Capillary Refill: Capillary refill takes less than 2 seconds.  Neurological:     General: No focal deficit present.     Mental Status: She is alert and oriented to person, place, and time.  Psychiatric:        Mood and Affect: Mood normal.        Behavior: Behavior normal.     BP (!) 142/92 (BP Location: Left Arm, Patient Position: Sitting)   Pulse 71   Temp 97.8 F (36.6 C) (Temporal)   Ht 5\' 7"  (1.702 m)   Wt 173 lb (78.5 kg)   SpO2 98%   BMI 27.10 kg/m  Wt Readings from Last 3 Encounters:  05/30/20 173 lb (78.5 kg)  03/20/20 170 lb 12.8 oz (77.5 kg)  03/08/20 170 lb 12.8 oz (77.5 kg)    Health Maintenance Due  Topic Date Due  . Hepatitis C Screening  Never done  . HPV VACCINES (1 - 2-dose series) Never done  . HIV Screening  Never done  . PAP-Cervical Cytology Screening  Never done  . PAP SMEAR-Modifier  Never done  . COVID-19 Vaccine (3 - Booster for Pfizer series) 05/15/2020       Topic Date Due  . HPV VACCINES (1 - 2-dose series) Never done    Lab Results  Component Value Date   WBC 7.7 12/01/2019   HGB 12.5 12/01/2019   HCT 37.8 12/01/2019   MCV 86.7 12/01/2019   PLT 294 12/01/2019   Lab Results  Component Value Date   NA 138 12/01/2019   K 4.1 12/01/2019   CO2 28 12/01/2019    GLUCOSE 80 12/01/2019   BUN 11 12/01/2019   CREATININE 0.86 12/01/2019   BILITOT 0.4 12/01/2019   AST 16 12/01/2019   ALT 15 12/01/2019   PROT 6.6 12/01/2019   CALCIUM 9.3 12/01/2019   ANIONGAP 8 11/16/2019       Assessment & Plan:    1. Acute low back pain, unspecified back pain laterality, unspecified whether sciatica present - CBC with Differential/Platelet - Comprehensive metabolic panel - POCT urinalysis dipstick - ketorolac (TORADOL) injection 60 mg - triamcinolone acetonide (KENALOG-40)  injection 60 mg  2. Fatigue, unspecified type - CBC with Differential/Platelet - Comprehensive metabolic panel - TSH - VITAMIN D 25 Hydroxy (Vit-D Deficiency, Fractures) - B12 and Folate Panel  3. Bone pain - VITAMIN D 25 Hydroxy (Vit-D Deficiency, Fractures)  4. Elevated BP without diagnosis of hypertension -Monitor BP -Heart healthy diet -Increase physical activity      Toradol 60 mg and Kenalog 60 mg injection given in office for back pain Rest, no heavy lifting Alternate heat and ice as needed to low-mid back Take Ibuprofen as directed for pain Take Flexeril three times daily as needed for muscle spasms Obtain tabs at [facility] as ordered Notify office if symptoms worsen or fail to improve    Follow-up: as needed  Signed, Janie Morning, NP

## 2020-05-30 NOTE — Patient Instructions (Signed)
Toradol 60 mg and Kenalog 60 mg injection given in office for back pain Rest, no heavy lifting Alternate heat and ice as needed to low-mid back Take Ibuprofen as directed for pain Take Flexeril three times daily as needed for muscle spasms Obtain tabs at [facility] as ordered Notify office if symptoms worsen or fail to improve Back Exercises These exercises help to make your trunk and back strong. They also help to keep the lower back flexible. Doing these exercises can help to prevent back pain or lessen existing pain.  If you have back pain, try to do these exercises 2-3 times each day or as told by your doctor.  As you get better, do the exercises once each day. Repeat the exercises more often as told by your doctor.  To stop back pain from coming back, do the exercises once each day, or as told by your doctor. Exercises Single knee to chest Do these steps 3-5 times in a row for each leg: 1. Lie on your back on a firm bed or the floor with your legs stretched out. 2. Bring one knee to your chest. 3. Grab your knee or thigh with both hands and hold them it in place. 4. Pull on your knee until you feel a gentle stretch in your lower back or buttocks. 5. Keep doing the stretch for 10-30 seconds. 6. Slowly let go of your leg and straighten it. Pelvic tilt Do these steps 5-10 times in a row: 1. Lie on your back on a firm bed or the floor with your legs stretched out. 2. Bend your knees so they point up to the ceiling. Your feet should be flat on the floor. 3. Tighten your lower belly (abdomen) muscles to press your lower back against the floor. This will make your tailbone point up to the ceiling instead of pointing down to your feet or the floor. 4. Stay in this position for 5-10 seconds while you gently tighten your muscles and breathe evenly. Cat-cow Do these steps until your lower back bends more easily: 1. Get on your hands and knees on a firm surface. Keep your hands under your  shoulders, and keep your knees under your hips. You may put padding under your knees. 2. Let your head hang down toward your chest. Tighten (contract) the muscles in your belly. Point your tailbone toward the floor so your lower back becomes rounded like the back of a cat. 3. Stay in this position for 5 seconds. 4. Slowly lift your head. Let the muscles of your belly relax. Point your tailbone up toward the ceiling so your back forms a sagging arch like the back of a cow. 5. Stay in this position for 5 seconds.   Press-ups Do these steps 5-10 times in a row: 1. Lie on your belly (face-down) on the floor. 2. Place your hands near your head, about shoulder-width apart. 3. While you keep your back relaxed and keep your hips on the floor, slowly straighten your arms to raise the top half of your body and lift your shoulders. Do not use your back muscles. You may change where you place your hands in order to make yourself more comfortable. 4. Stay in this position for 5 seconds. 5. Slowly return to lying flat on the floor.   Bridges Do these steps 10 times in a row: 1. Lie on your back on a firm surface. 2. Bend your knees so they point up to the ceiling. Your feet should be  flat on the floor. Your arms should be flat at your sides, next to your body. 3. Tighten your butt muscles and lift your butt off the floor until your waist is almost as high as your knees. If you do not feel the muscles working in your butt and the back of your thighs, slide your feet 1-2 inches farther away from your butt. 4. Stay in this position for 3-5 seconds. 5. Slowly lower your butt to the floor, and let your butt muscles relax. If this exercise is too easy, try doing it with your arms crossed over your chest.   Belly crunches Do these steps 5-10 times in a row: 1. Lie on your back on a firm bed or the floor with your legs stretched out. 2. Bend your knees so they point up to the ceiling. Your feet should be flat on  the floor. 3. Cross your arms over your chest. 4. Tip your chin a little bit toward your chest but do not bend your neck. 5. Tighten your belly muscles and slowly raise your chest just enough to lift your shoulder blades a tiny bit off of the floor. Avoid raising your body higher than that, because it can put too much stress on your low back. 6. Slowly lower your chest and your head to the floor. Back lifts Do these steps 5-10 times in a row: 1. Lie on your belly (face-down) with your arms at your sides, and rest your forehead on the floor. 2. Tighten the muscles in your legs and your butt. 3. Slowly lift your chest off of the floor while you keep your hips on the floor. Keep the back of your head in line with the curve in your back. Look at the floor while you do this. 4. Stay in this position for 3-5 seconds. 5. Slowly lower your chest and your face to the floor. Contact a doctor if:  Your back pain gets a lot worse when you do an exercise.  Your back pain does not get better 2 hours after you exercise. If you have any of these problems, stop doing the exercises. Do not do them again unless your doctor says it is okay. Get help right away if:  You have sudden, very bad back pain. If this happens, stop doing the exercises. Do not do them again unless your doctor says it is okay. This information is not intended to replace advice given to you by your health care provider. Make sure you discuss any questions you have with your health care provider. Document Revised: 10/23/2017 Document Reviewed: 10/23/2017 Elsevier Patient Education  2021 [employer] Acute Back Pain, Adult Acute back pain is sudden and usually short-lived. It is often caused by an injury to the muscles and tissues in the back. The injury may result from:  A muscle or ligament getting overstretched or torn (strained). Ligaments are tissues that connect bones to each other. Lifting something improperly can cause a back  strain.  Wear and tear (degeneration) of the spinal disks. Spinal disks are circular tissue that provide cushioning between the bones of the spine (vertebrae).  Twisting motions, such as while playing sports or doing yard work.  A hit to the back.  Arthritis. You may have a physical exam, lab tests, and imaging tests to find the cause of your pain. Acute back pain usually goes away with rest and home care. Follow these instructions at home: Managing pain, stiffness, and swelling  Treatment may include medicines  for pain and inflammation that are taken by mouth or applied to the skin, prescription pain medicine, or muscle relaxants. Take over-the-counter and prescription medicines only as told by your health care provider.  Your health care provider may recommend applying ice during the first 24-48 hours after your pain starts. To do this: ? Put ice in a plastic bag. ? Place a towel between your skin and the bag. ? Leave the ice on for 20 minutes, 2-3 times a day.  If directed, apply heat to the affected area as often as told by your health care provider. Use the heat source that your health care provider recommends, such as a moist heat pack or a heating pad. ? Place a towel between your skin and the heat source. ? Leave the heat on for 20-30 minutes. ? Remove the heat if your skin turns bright red. This is especially important if you are unable to feel pain, heat, or cold. You have a greater risk of getting burned. Activity  Do not stay in bed. Staying in bed for more than 1-2 days can delay your recovery.  Sit up and stand up straight. Avoid leaning forward when you sit or hunching over when you stand. ? If you work at a desk, sit close to it so you do not need to lean over. Keep your chin tucked in. Keep your neck drawn back, and keep your elbows bent at a 90-degree angle (right angle). ? Sit high and close to the steering wheel when you drive. Add lower back (lumbar) support to your  car seat, if needed.  Take short walks on even surfaces as soon as you are able. Try to increase the length of time you walk each day.  Do not sit, drive, or stand in one place for more than 30 minutes at a time. Sitting or standing for long periods of time can put stress on your back.  Do not drive or use heavy machinery while taking prescription pain medicine.  Use proper lifting techniques. When you bend and lift, use positions that put less stress on your back: ? [place] your knees. ? Keep the load close to your body. ? Avoid twisting.  Exercise regularly as told by your health care provider. Exercising helps your back heal faster and helps prevent back injuries by keeping muscles strong and flexible.  Work with a physical therapist to make a safe exercise program, as recommended by your health care provider. Do any exercises as told by your physical therapist.   Lifestyle  Maintain a healthy weight. Extra weight puts stress on your back and makes it difficult to have good posture.  Avoid activities or situations that make you feel anxious or stressed. Stress and anxiety increase muscle tension and can make back pain worse. Learn ways to manage anxiety and stress, such as through exercise. General instructions  Sleep on a firm mattress in a comfortable position. Try lying on your side with your knees slightly bent. If you lie on your back, put a pillow under your knees.  Follow your treatment plan as told by your health care provider. This may include: ? Cognitive or behavioral therapy. ? Acupuncture or massage therapy. ? Meditation or yoga. Contact a health care provider if:  You have pain that is not relieved with rest or medicine.  You have increasing pain going down into your legs or buttocks.  Your pain does not improve after 2 weeks.  You have pain at night.  You  lose weight without trying.  You have a fever or chills. Get help right away if:  You develop new bowel  or bladder control problems.  You have unusual weakness or numbness in your arms or legs.  You develop nausea or vomiting.  You develop abdominal pain.  You feel faint. Summary  Acute back pain is sudden and usually short-lived.  Use proper lifting techniques. When you bend and lift, use positions that put less stress on your back.  Take over-the-counter and prescription medicines and apply heat or ice as directed by your health care provider. This information is not intended to replace advice given to you by your health care provider. Make sure you discuss any questions you have with your health care provider. Document Revised: 10/22/2019 Document Reviewed: 10/22/2019 Elsevier Patient Education  2021 [employer].

## 2020-05-31 ENCOUNTER — Other Ambulatory Visit: Payer: Self-pay | Admitting: Nurse Practitioner

## 2020-05-31 DIAGNOSIS — E559 Vitamin D deficiency, unspecified: Secondary | ICD-10-CM

## 2020-05-31 LAB — COMPREHENSIVE METABOLIC PANEL
AG Ratio: 1.7 (calc) (ref 1.0–2.5)
ALT: 14 U/L (ref 6–29)
AST: 13 U/L (ref 10–30)
Albumin: 4.3 g/dL (ref 3.6–5.1)
Alkaline phosphatase (APISO): 70 U/L (ref 31–125)
BUN: 11 mg/dL (ref 7–25)
CO2: 27 mmol/L (ref 20–32)
Calcium: 9.3 mg/dL (ref 8.6–10.2)
Chloride: 106 mmol/L (ref 98–110)
Creat: 0.67 mg/dL (ref 0.50–1.10)
Globulin: 2.5 g/dL (calc) (ref 1.9–3.7)
Glucose, Bld: 86 mg/dL (ref 65–139)
Potassium: 4.1 mmol/L (ref 3.5–5.3)
Sodium: 139 mmol/L (ref 135–146)
Total Bilirubin: 0.2 mg/dL (ref 0.2–1.2)
Total Protein: 6.8 g/dL (ref 6.1–8.1)

## 2020-05-31 LAB — CBC WITH DIFFERENTIAL/PLATELET
Absolute Monocytes: 598 cells/uL (ref 200–950)
Basophils Absolute: 67 cells/uL (ref 0–200)
Basophils Relative: 1.1 %
Eosinophils Absolute: 110 cells/uL (ref 15–500)
Eosinophils Relative: 1.8 %
HCT: 36.4 % (ref 35.0–45.0)
Hemoglobin: 11.6 g/dL — ABNORMAL LOW (ref 11.7–15.5)
Lymphs Abs: 1562 cells/uL (ref 850–3900)
MCH: 26.4 pg — ABNORMAL LOW (ref 27.0–33.0)
MCHC: 31.9 g/dL — ABNORMAL LOW (ref 32.0–36.0)
MCV: 82.9 fL (ref 80.0–100.0)
MPV: 10 fL (ref 7.5–12.5)
Monocytes Relative: 9.8 %
Neutro Abs: 3764 cells/uL (ref 1500–7800)
Neutrophils Relative %: 61.7 %
Platelets: 363 10*3/uL (ref 140–400)
RBC: 4.39 10*6/uL (ref 3.80–5.10)
RDW: 13.2 % (ref 11.0–15.0)
Total Lymphocyte: 25.6 %
WBC: 6.1 10*3/uL (ref 3.8–10.8)

## 2020-05-31 LAB — B12 AND FOLATE PANEL
Folate: 13.8 ng/mL
Vitamin B-12: 337 pg/mL (ref 200–1100)

## 2020-05-31 LAB — VITAMIN D 25 HYDROXY (VIT D DEFICIENCY, FRACTURES): Vit D, 25-Hydroxy: 21 ng/mL — ABNORMAL LOW (ref 30–100)

## 2020-05-31 LAB — TSH: TSH: 2.55 mIU/L

## 2020-05-31 MED ORDER — VITAMIN D (ERGOCALCIFEROL) 1.25 MG (50000 UNIT) PO CAPS: 50000.0000 [IU] | ORAL_CAPSULE | ORAL | 2 refills | Status: DC

## 2020-07-02 ENCOUNTER — Encounter: Payer: Self-pay | Admitting: Nurse Practitioner

## 2020-07-05 ENCOUNTER — Other Ambulatory Visit: Payer: Self-pay | Admitting: Nurse Practitioner

## 2020-07-05 DIAGNOSIS — E559 Vitamin D deficiency, unspecified: Secondary | ICD-10-CM

## 2020-07-05 MED ORDER — VITAMIN D (ERGOCALCIFEROL) 1.25 MG (50000 UNIT) PO CAPS: 50000.0000 [IU] | ORAL_CAPSULE | ORAL | 0 refills | Status: DC

## 2020-08-02 ENCOUNTER — Other Ambulatory Visit: Payer: Self-pay | Admitting: Nurse Practitioner

## 2020-08-02 DIAGNOSIS — K219 Gastro-esophageal reflux disease without esophagitis: Secondary | ICD-10-CM

## 2020-08-07 ENCOUNTER — Encounter: Payer: Self-pay | Admitting: Nurse Practitioner

## 2020-08-07 ENCOUNTER — Ambulatory Visit: Payer: BC Managed Care – PPO | Admitting: Nurse Practitioner

## 2020-08-07 ENCOUNTER — Other Ambulatory Visit: Payer: Self-pay

## 2020-08-07 VITALS — BP 132/90 | HR 94 | Temp 98.2°F | Ht 67.0 in | Wt 169.0 lb

## 2020-08-07 DIAGNOSIS — M2550 Pain in unspecified joint: Secondary | ICD-10-CM

## 2020-08-07 DIAGNOSIS — M791 Myalgia, unspecified site: Secondary | ICD-10-CM

## 2020-08-07 DIAGNOSIS — Z832 Family history of diseases of the blood and blood-forming organs and certain disorders involving the immune mechanism: Secondary | ICD-10-CM

## 2020-08-07 DIAGNOSIS — D508 Other iron deficiency anemias: Secondary | ICD-10-CM | POA: Diagnosis not present

## 2020-08-07 DIAGNOSIS — R5382 Chronic fatigue, unspecified: Secondary | ICD-10-CM

## 2020-08-07 DIAGNOSIS — E559 Vitamin D deficiency, unspecified: Secondary | ICD-10-CM

## 2020-08-07 NOTE — Patient Instructions (Addendum)
Continue medications as prescribed We will call you with referral to specialists appointments Follow-up as needed   Iron-Rich Diet  Iron is a mineral that helps your body produce hemoglobin. Hemoglobin is a protein in red blood cells that carries oxygen to your body's tissues. Eating too little iron may cause you to feel weak and tired, and it can increase your risk of infection. Iron is naturally found in many foods, and many foods have iron added to them (are iron-fortified). You may need to follow an iron-rich diet if you do not have enough iron in your body due to certain medical conditions. The amount of iron that you need each day depends on your age, your sex, and any medical conditions you have. Follow instructions from your health care provider or a dietitian about how much ironyou should eat each day. What are tips for following this plan? Reading food labels Check food labels to see how many milligrams (mg) of iron are in each serving. Cooking Cook foods in pots and pans that are made from iron. Take these steps to make it easier for your body to absorb iron from certain foods: Soak beans overnight before cooking. Soak whole grains overnight and drain them before using. Ferment flours before baking, such as by using yeast in bread dough. Meal planning When you eat foods that contain iron, you should eat them with foods that are high in vitamin C. These include oranges, peppers, tomatoes, potatoes, and mangoes. Vitamin C helps your body absorb iron. Certain foods and drinks prevent your body from absorbing iron properly. Avoid eating these foods in the same meal as iron-rich foods or with iron supplements. These foods include: Coffee, black tea, and red wine. Milk, dairy products, and foods that are high in calcium. Beans and soybeans. Whole grains. General information Take iron supplements only as told by your health care provider. An overdose of iron can be life-threatening. If  you were prescribed iron supplements, take them with orange juice or a vitamin C supplement. When you eat iron-fortified foods or take an iron supplement, you should also eat foods that naturally contain iron, such as meat, poultry, and fish. Eating naturally iron-rich foods helps your body absorb the iron that is added to other foods or contained in a supplement. Iron from animal sources is better absorbed than iron from plant sources. What foods should I eat? Fruits Prunes. Raisins. Eat fruits high in vitamin C, such as oranges, grapefruits, and strawberries,with iron-rich foods. Vegetables Spinach (cooked). Green peas. Broccoli. Fermented vegetables. Eat vegetables high in vitamin C, such as leafy greens, potatoes, bell peppers,and tomatoes, with iron-rich foods. Grains Iron-fortified breakfast cereal. Iron-fortified whole-wheat bread. Enrichedrice. Sprouted grains. Meats and other proteins Beef liver. Beef. [place]. Chicken. Oysters. Shrimp. Tuna. Sardines. Chickpeas.Nuts. Tofu. Pumpkin seeds. Beverages Tomato juice. Fresh orange juice. Prune juice. Hibiscus tea. Iron-fortifiedinstant breakfast shakes. Sweets and desserts Blackstrap molasses. Seasonings and condiments Tahini. Fermented soy sauce. Other foods Wheat germ. The items listed above may not be a complete list of recommended foods and beverages. Contact a dietitian for more information. What foods should I limit? These are foods that should be limited while eating iron-rich foods as they canreduce the absorption of iron in your body. Grains Whole grains. Bran cereal. Bran flour. Meats and other proteins Soybeans. Products made from soy protein. Black beans. Lentils. Mung beans.Split peas. Dairy Milk. Cream. Cheese. Yogurt. Cottage cheese. Beverages Coffee. Black tea. Red wine. Sweets and desserts Cocoa. Chocolate. Ice cream. Seasonings and  condiments Basil. Oregano. Large amounts of parsley. The items listed above may  not be a complete list of foods and beverages you should limit. Contact a dietitian for more information. Summary Iron is a mineral that helps your body produce hemoglobin. Hemoglobin is a protein in red blood cells that carries oxygen to your body's tissues. Iron is naturally found in many foods, and many foods have iron added to them (are iron-fortified). When you eat foods that contain iron, you should eat them with foods that are high in vitamin C. Vitamin C helps your body absorb iron. Certain foods and drinks prevent your body from absorbing iron properly, such as whole grains and dairy products. You should avoid eating these foods in the same meal as iron-rich foods or with iron supplements. This information is not intended to replace advice given to you by your health care provider. Make sure you discuss any questions you have with your healthcare provider. Document Revised: 01/10/2020 Document Reviewed: 01/10/2020 Elsevier Patient Education  2022 [employer]   Iron Deficiency Anemia, Adult Iron deficiency anemia is when you do not have enough red blood cells or hemoglobin in your blood. This happens because you have too little iron in your body. Hemoglobin carries oxygen to parts of the body. Anemia can cause yourbody to not get enough oxygen. What are the causes? Not eating enough foods that have iron in them. The body not being able to take in iron well. Needing more iron due to pregnancy or heavy menstrual periods, for females. Cancer. Bleeding in the bowels. Many blood draws. What increases the risk? Being pregnant. Being a teenage girl going through a growth spurt. What are the signs or symptoms? Pale skin, lips, and nails. Weakness, dizziness, and getting tired easily. Headache. Feeling like you cannot breathe well when moving (shortness of breath). Cold hands and feet. Fast heartbeat or a heartbeat that is not regular. Feeling grouchy (irritable) or breathing fast.  These are more common in very bad anemia. Mild anemia may not cause any symptoms. How is this treated? This condition is treated by finding out why you do not have enough iron and then getting more iron. It may include: Adding foods to your diet that have a lot of iron. Taking iron pills (supplements). If you are pregnant or breastfeeding, you may need to take extra iron. Your diet often does not provide the amount of iron that you need. Getting more vitamin C in your diet. Vitamin C helps your body take in iron. You may need to take iron pills with a glass of orange juice or vitamin C pills. Medicines to make heavy menstrual periods lighter. Surgery. You may need blood tests to see if treatment is working. If the treatment doesnot seem to be working, you may need more tests. Follow these instructions at home: Medicines Take over-the-counter and prescription medicines only as told by your doctor. This includes iron pills and vitamins. Take iron pills when your stomach is empty. If you cannot handle this, take them with food. Do not drink milk or take antacids at the same time as your iron pills. Iron pills may turn your poop (stool)black. If you cannot handle taking iron pills by mouth, ask your doctor about getting iron through: An IV tube. A shot (injection) into a muscle. Eating and drinking  Talk with your doctor before changing the foods you eat. He or she may tell you to eat foods that have a lot of iron, such as: Liver.  Low-fat (lean) beef. Breads and cereals that have iron added to them. Eggs. Dried fruit. Dark green, leafy vegetables. Eat fresh fruits and vegetables that are high in vitamin C. They help your body use iron. Foods with a lot of vitamin C include: Oranges. Peppers. Tomatoes. Mangoes. Drink enough fluid to keep your pee (urine) pale yellow.  Managing constipation If you are taking iron pills, they may cause trouble pooping (constipation). To prevent or treat  trouble pooping, you may need to: Take over-the-counter or prescription medicines. Eat foods that are high in fiber. These include beans, whole grains, and fresh fruits and vegetables. Limit foods that are high in fat and sugar. These include fried or sweet foods. General instructions Return to your normal activities as told by your doctor. Ask your doctor what activities are safe for you. Keep yourself clean, and keep things clean around you. Keep all follow-up visits as told by your doctor. This is important. Contact a doctor if: You feel like you may vomit (nauseous), or you vomit. You feel weak. You are sweating for no reason. You have trouble pooping, such as: Pooping less than 3 times a week. Straining to poop. Having poop that is hard, dry, or larger than normal. Feeling full or bloated. Pain in the lower belly. Not feeling better after pooping. Get help right away if: You pass out (faint). You have chest pain. You have trouble breathing that: Is very bad. Gets worse with physical activity. You have a fast heartbeat, or a heartbeat that does not feel regular. You get light-headed when getting up from sitting or lying down. These symptoms may be an emergency. Do not wait to see if the symptoms will go away. Get medical help right away. Call your local emergency services (911 in the [place] Do not drive yourself to the hospital. Summary Iron deficiency anemia is when you have too little iron in your body. This condition is treated by finding out why you do not have enough iron in your body and then getting more iron. Take over-the-counter and prescription medicines only as told by your doctor. Eat fresh fruits and vegetables that are high in vitamin C. Get help right away if you cannot breathe well. This information is not intended to replace advice given to you by your health care provider. Make sure you discuss any questions you have with your healthcare provider. Document  Revised: 10/06/2018 Document Reviewed: 10/06/2018 Elsevier Patient Education  2022 [employer].

## 2020-08-07 NOTE — Progress Notes (Signed)
Acute Office Visit  Subjective:    Patient ID: Cassidy Shaffer, female    DOB: December 09, 1995, 25 y.o.   MRN: [id number]  Chief Complaint  Patient presents with   Joint pain/Muscle aches    HPI Cassidy Shaffer is a 25 year old Caucasian female that presents with joint pain and muscle aches. She was recently found to have Vitamin D and iron deficiency. She is currently being followed by ob/gyn for abnormal uterine bleeding and management of iron deficiency anemia. She is a [employer] fund[employer]. She states that one of the physicians she is talks to regularly recommended follow-up with rheumatology and possibly hematology for further evaluation. She is requesting for referral to specialists.States her mother and grandfather have autoimmune diseases.  Past Medical History:  Diagnosis Date   Allergy    Epigastric pain    GERD (gastroesophageal reflux disease)    Hyperlipidemia    borderline    Hypertension    past hx in nursing school     Past Surgical History:  Procedure Laterality Date   WISDOM TOOTH EXTRACTION      Family History  Problem Relation Age of Onset   Hyperlipidemia Mother    Osteoporosis Mother    Fibromyalgia Mother    Hypertension Father    Esophagitis Father    Hyperlipidemia Father    Colon polyps Father    Hypertension Maternal Grandmother    Diabetes Mellitus II Maternal Grandfather    COPD Maternal Grandfather    Hyperlipidemia Maternal Grandfather    Hypertension Maternal Grandfather    Hypertension Paternal Grandmother    Hyperlipidemia Paternal Grandmother    Hypertension Paternal Grandfather    Stroke Paternal Grandfather    Hyperlipidemia Paternal Grandfather    Heart attack Paternal Grandfather    Colon polyps Paternal Grandfather    Pancreatic cancer Maternal Great-grandfather    Colon cancer Neg Hx    Esophageal cancer Neg Hx    Rectal cancer Neg Hx    Stomach cancer Neg Hx     Social History   Socioeconomic History   Marital status: Single    Spouse name:  Not on file   Number of children: Not on file   Years of education: Not on file   Highest education level: Not on file  Occupational History   Not on file  Tobacco Use   Smoking status: Never   Smokeless tobacco: Never  Vaping Use   Vaping Use: Never used  Substance and Sexual Activity   Alcohol use: Never   Drug use: Never   Sexual activity: Never  Other Topics Concern   Not on file  Social History Narrative   Not on file   Social Determinants of Health   Financial Resource Strain: Not on file  Food Insecurity: Not on file  Transportation Needs: Not on file  Physical Activity: Not on file  Stress: Not on file  Social Connections: Not on file  Intimate Partner Violence: Not on file    Outpatient Medications Prior to Visit  Medication Sig Dispense Refill   omeprazole (PRILOSEC) 20 MG capsule TAKE 1 CAPSULE BY MOUTH EVERY DAY 90 capsule 1   Vitamin D, Ergocalciferol, (DRISDOL) 1.25 MG (50000 UNIT) CAPS capsule Take 1 capsule (50,000 Units total) by mouth every 7 (seven) days. 12 capsule 0   No facility-administered medications prior to visit.    No Known Allergies  Review of Systems  Constitutional:  Positive for fatigue.  HENT: Negative.    Eyes: Negative.   Respiratory: Negative.  Cardiovascular: Negative.   Gastrointestinal:  Positive for abdominal pain, constipation and diarrhea.  Endocrine: Negative.   Genitourinary:  Positive for menstrual problem.  Musculoskeletal:  Positive for arthralgias and myalgias.  Skin: Negative.   Allergic/Immunologic: Negative.   Neurological: Negative.   Hematological: Negative.   Psychiatric/Behavioral: Negative.        Objective:    Physical Exam Vitals reviewed.  Constitutional:      Appearance: Normal appearance.  Pulmonary:     Effort: Pulmonary effort is normal.  Skin:    General: Skin is warm and dry.     Capillary Refill: Capillary refill takes less than 2 seconds.  Neurological:     General: No focal  deficit present.     Mental Status: She is alert and oriented to person, place, and time.  Psychiatric:        Mood and Affect: Mood normal.        Behavior: Behavior normal.   BP 132/90 (BP Location: Left Arm, Patient Position: Sitting)   Pulse 94   Temp 98.2 F (36.8 C) (Temporal)   Ht 5\' 7"  (1.702 m)   Wt 169 lb (76.7 kg)   SpO2 99%   BMI 26.47 kg/m  Wt Readings from Last 3 Encounters:  08/07/20 169 lb (76.7 kg)  05/30/20 173 lb (78.5 kg)  03/20/20 170 lb 12.8 oz (77.5 kg)    Health Maintenance Due  Topic Date Due   HPV VACCINES (1 - 2-dose series) Never done   HIV Screening  Never done   Hepatitis C Screening  Never done   PAP-Cervical Cytology Screening  Never done   PAP SMEAR-Modifier  Never done   COVID-19 Vaccine (3 - Booster for Pfizer series) 04/14/2020       Topic Date Due   HPV VACCINES (1 - 2-dose series) Never done     Lab Results  Component Value Date   TSH 2.55 05/30/2020   Lab Results  Component Value Date   WBC 6.1 05/30/2020   HGB 11.6 (L) 05/30/2020   HCT 36.4 05/30/2020   MCV 82.9 05/30/2020   PLT 363 05/30/2020   Lab Results  Component Value Date   NA 139 05/30/2020   K 4.1 05/30/2020   CO2 27 05/30/2020   GLUCOSE 86 05/30/2020   BUN 11 05/30/2020   CREATININE 0.67 05/30/2020   BILITOT 0.2 05/30/2020   AST 13 05/30/2020   ALT 14 05/30/2020   PROT 6.8 05/30/2020   CALCIUM 9.3 05/30/2020   ANIONGAP 8 11/16/2019       Assessment & Plan:   1. Chronic fatigue - Ambulatory referral to Rheumatology - Ambulatory referral to Hematology / Oncology  2. Arthralgia, unspecified joint - Ambulatory referral to Rheumatology  3. Vitamin D deficiency - Vitamin D, 25-hydroxy; Future  4. Other iron deficiency anemia - Ambulatory referral to Hematology / Oncology  5. Family history of autoimmune disorder - Ambulatory referral to Rheumatology - Ambulatory referral to Hematology / Oncology  6. Myalgia - Ambulatory referral to  Hematology / Oncology    Continue medications as prescribed We will call you with referral to specialists appointments Follow-up as needed   Follow-up: PRN  Signed, 01/16/2020, NP

## 2020-08-07 NOTE — Progress Notes (Deleted)
Subjective:  Patient ID: Cassidy Shaffer, female    DOB: 1995/06/20  Age: 25 y.o. MRN: [id number]  Chief Complaint  Patient presents with   Joint pain/Muscle aches    HPI   Current Outpatient Medications on File Prior to Visit  Medication Sig Dispense Refill   omeprazole (PRILOSEC) 20 MG capsule TAKE 1 CAPSULE BY MOUTH EVERY DAY 90 capsule 1   Vitamin D, Ergocalciferol, (DRISDOL) 1.25 MG (50000 UNIT) CAPS capsule Take 1 capsule (50,000 Units total) by mouth every 7 (seven) days. 12 capsule 0   No current facility-administered medications on file prior to visit.   Past Medical History:  Diagnosis Date   Allergy    Epigastric pain    GERD (gastroesophageal reflux disease)    Hyperlipidemia    borderline    Hypertension    past hx in nursing school    Past Surgical History:  Procedure Laterality Date   WISDOM TOOTH EXTRACTION      Family History  Problem Relation Age of Onset   Hyperlipidemia Mother    Osteoporosis Mother    Fibromyalgia Mother    Hypertension Father    Esophagitis Father    Hyperlipidemia Father    Colon polyps Father    Hypertension Maternal Grandmother    Diabetes Mellitus II Maternal Grandfather    COPD Maternal Grandfather    Hyperlipidemia Maternal Grandfather    Hypertension Maternal Grandfather    Hypertension Paternal Grandmother    Hyperlipidemia Paternal Grandmother    Hypertension Paternal Grandfather    Stroke Paternal Grandfather    Hyperlipidemia Paternal Grandfather    Heart attack Paternal Grandfather    Colon polyps Paternal Grandfather    Pancreatic cancer Maternal Great-grandfather    Colon cancer Neg Hx    Esophageal cancer Neg Hx    Rectal cancer Neg Hx    Stomach cancer Neg Hx    Social History   Socioeconomic History   Marital status: Single    Spouse name: Not on file   Number of children: Not on file   Years of education: Not on file   Highest education level: Not on file  Occupational History   Not on file   Tobacco Use   Smoking status: Never   Smokeless tobacco: Never  Vaping Use   Vaping Use: Never used  Substance and Sexual Activity   Alcohol use: Never   Drug use: Never   Sexual activity: Never  Other Topics Concern   Not on file  Social History Narrative   Not on file   Social Determinants of Health   Financial Resource Strain: Not on file  Food Insecurity: Not on file  Transportation Needs: Not on file  Physical Activity: Not on file  Stress: Not on file  Social Connections: Not on file    Review of Systems  Constitutional:  Positive for fatigue. Negative for fever.  HENT:  Negative for congestion, ear pain, sinus pressure and sore throat.   Eyes:  Negative for pain.  Respiratory:  Negative for cough, chest tightness, shortness of breath and wheezing.   Cardiovascular:  Negative for chest pain and palpitations.  Gastrointestinal:  Positive for abdominal pain, constipation and diarrhea. Negative for nausea and vomiting.  Genitourinary:  Negative for dysuria and hematuria.  Musculoskeletal:  Positive for myalgias. Negative for arthralgias, back pain and joint swelling.  Skin:  Negative for rash.  Neurological:  Negative for dizziness, weakness and headaches.  Psychiatric/Behavioral:  Negative for dysphoric mood. The patient  is not nervous/anxious.     Objective:  BP 132/90 (BP Location: Left Arm, Patient Position: Sitting)   Pulse 94   Temp 98.2 F (36.8 C) (Temporal)   Ht 5\' 7"  (1.702 m)   Wt 169 lb (76.7 kg)   SpO2 99%   BMI 26.47 kg/m   BP/Weight 08/07/2020 05/30/2020 03/20/2020  Systolic BP 132 142 115  Diastolic BP 90 92 71  Wt. (Lbs) 169 173 170.8  BMI 26.47 27.1 26.75    Physical Exam Vitals reviewed.  Constitutional:      Appearance: Normal appearance. She is normal weight.  HENT:     Right Ear: Tympanic membrane, ear canal and external ear normal.     Left Ear: Tympanic membrane, ear canal and external ear normal.     Nose: Nose normal.      Mouth/Throat:     Mouth: Mucous membranes are moist.  Cardiovascular:     Rate and Rhythm: Normal rate and regular rhythm.     Pulses: Normal pulses.     Heart sounds: Normal heart sounds.  Pulmonary:     Effort: Pulmonary effort is normal.     Breath sounds: Normal breath sounds.  Abdominal:     Palpations: Abdomen is soft.  Musculoskeletal:        General: Normal range of motion.     Cervical back: Normal range of motion.  Skin:    General: Skin is warm and dry.  Neurological:     Mental Status: She is alert and oriented to person, place, and time.  Psychiatric:        Mood and Affect: Mood normal.        Behavior: Behavior normal.        Thought Content: Thought content normal.        Judgment: Judgment normal.    Diabetic Foot Exam - Simple   No data filed      Lab Results  Component Value Date   WBC 6.1 05/30/2020   HGB 11.6 (L) 05/30/2020   HCT 36.4 05/30/2020   PLT 363 05/30/2020   GLUCOSE 86 05/30/2020   ALT 14 05/30/2020   AST 13 05/30/2020   NA 139 05/30/2020   K 4.1 05/30/2020   CL 106 05/30/2020   CREATININE 0.67 05/30/2020   BUN 11 05/30/2020   CO2 27 05/30/2020   TSH 2.55 05/30/2020      Assessment & Plan:   There are no diagnoses linked to this encounter.   No orders of the defined types were placed in this encounter.   No orders of the defined types were placed in this encounter.    Follow-up: No follow-ups on file.  An After Visit Summary was printed and given to the patient.   I, 06/01/2020 as a scribe for Elmer Sow, NP.,have documented all relevant documentation on the behalf of Janie Morning, NP,as directed by  Janie Morning, NP while in the presence of Janie Morning, NP.   Janie Morning, NP [facility] [phone]

## 2020-08-09 ENCOUNTER — Ambulatory Visit: Payer: BC Managed Care – PPO | Admitting: Nurse Practitioner

## 2020-08-10 ENCOUNTER — Telehealth: Payer: Self-pay | Admitting: Hematology and Oncology

## 2020-08-10 NOTE — Telephone Encounter (Signed)
Received a new hem referral from [facility] for IDA. Ms. Hemmerich has been cld and scheduled to see Dr. Pamelia Hoit on 7/11 at 9am. Pt aware to arrive 20 minutes early.

## 2020-08-19 NOTE — Progress Notes (Signed)
[facility] CONSULT NOTE  Patient Care Team: Janie Morning, NP as PCP - General (Nurse Practitioner)  CHIEF COMPLAINTS/PURPOSE OF CONSULTATION:  Newly diagnosed iron deficiency anemia  HISTORY OF PRESENTING ILLNESS:  Cassidy Shaffer 25 y.o. female is here because of recent diagnosis of IDA. Labs on 05/30/20 showed RBC 4.39, HGB 11.6, HCT 36.4, MCV 82.9, PLT 363, Vit D 21. She presents to the clinic today for initial evaluation and discussion of treatment options.  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  This been ongoing for the past 6 months or so.  Recently she has had heavy menstrual bleedings and she is seen her gynecologist who had performed ultrasound and did not identify any clear etiology.  She tells me that a few months ago she had abdominal CT scan which was benign.  I reviewed her records extensively and collaborated the history with the patient.  MEDICAL HISTORY:  Past Medical History:  Diagnosis Date   Allergy    Epigastric pain    GERD (gastroesophageal reflux disease)    Hyperlipidemia    borderline    Hypertension    past hx in nursing school     SURGICAL HISTORY: Past Surgical History:  Procedure Laterality Date   WISDOM TOOTH EXTRACTION      SOCIAL HISTORY: Social History   Socioeconomic History   Marital status: Single    Spouse name: Not on file   Number of children: Not on file   Years of education: Not on file   Highest education level: Not on file  Occupational History   Not on file  Tobacco Use   Smoking status: Never   Smokeless tobacco: Never  Vaping Use   Vaping Use: Never used  Substance and Sexual Activity   Alcohol use: Never   Drug use: Never   Sexual activity: Never  Other Topics Concern   Not on file  Social History Narrative   Not on file   Social Determinants of Health   Financial Resource Strain: Not on file   Food Insecurity: Not on file  Transportation Needs: Not on file  Physical Activity: Not on file  Stress: Not on file  Social Connections: Not on file  Intimate Partner Violence: Not on file    FAMILY HISTORY: Family History  Problem Relation Age of Onset   Hyperlipidemia Mother    Osteoporosis Mother    Fibromyalgia Mother    Hypertension Father    Esophagitis Father    Hyperlipidemia Father    Colon polyps Father    Hypertension Maternal Grandmother    Diabetes Mellitus II Maternal Grandfather    COPD Maternal Grandfather    Hyperlipidemia Maternal Grandfather    Hypertension Maternal Grandfather    Hypertension Paternal Grandmother    Hyperlipidemia Paternal Grandmother    Hypertension Paternal Grandfather    Stroke Paternal Grandfather    Hyperlipidemia Paternal Grandfather    Heart attack Paternal Grandfather    Colon polyps Paternal Grandfather    Pancreatic cancer Maternal Great-grandfather    Colon cancer Neg Hx    Esophageal cancer Neg Hx    Rectal cancer Neg Hx    Stomach cancer Neg Hx     ALLERGIES:  has No Known Allergies.  MEDICATIONS:  Current Outpatient Medications  Medication Sig Dispense Refill   omeprazole (PRILOSEC) 20 MG capsule TAKE 1 CAPSULE BY MOUTH EVERY DAY 90  capsule 1   Vitamin D, Ergocalciferol, (DRISDOL) 1.25 MG (50000 UNIT) CAPS capsule Take 1 capsule (50,000 Units total) by mouth every 7 (seven) days. 12 capsule 0   No current facility-administered medications for this visit.    REVIEW OF SYSTEMS:   Constitutional: Severe fatigue and generalized weakness All other systems were reviewed with the patient and are negative.  PHYSICAL EXAMINATION: ECOG PERFORMANCE STATUS: 1 - Symptomatic but completely ambulatory  Vitals:   08/21/20 0838  BP: 135/86  Pulse: (!) 110  Resp: 17  Temp: 97.6 F (36.4 C)  SpO2: 98%   Filed Weights   08/21/20 0838  Weight: 166 lb 3.2 oz (75.4 kg)      LABORATORY DATA:  I have reviewed the data  as listed Lab Results  Component Value Date   WBC 6.1 05/30/2020   HGB 11.6 (L) 05/30/2020   HCT 36.4 05/30/2020   MCV 82.9 05/30/2020   PLT 363 05/30/2020   Lab Results  Component Value Date   NA 139 05/30/2020   K 4.1 05/30/2020   CL 106 05/30/2020   CO2 27 05/30/2020    RADIOGRAPHIC STUDIES: I have personally reviewed the radiological reports and agreed with the findings in the report.  ASSESSMENT AND PLAN:  Iron deficiency anemia 05/30/20 showed RBC 4.39, HGB 11.6, HCT 36.4, MCV 82.9, PLT 363, Vit D 21, B12 337 07/21/2020: Hemoglobin 13, platelets 351, MCV 86, creatinine 0.81, ferritin 3, iron saturation 6%, TIBC 419  Patient has profound iron deficiency with severe symptoms of fatigue and generalized weakness. This is been ongoing for the past 6 months. Most likely etiology: Heavy uterine bleeding  Recommendation: 3 doses of IV Venofer Instructed her to see her gastroenterologist as well. Return to clinic 1 month after finishing IV iron to recheck the labs and do a MyChart virtual visit after that. She gets labs through [facility].  Therefore I gave her a prescription for the lab information for her to draw another set of labs before iron infusion but also 1 month after finishing IV iron.  All questions were answered. The patient knows to call the clinic with any problems, questions or concerns.   Sabas Sous, MD, MPH 08/21/2020    I, Alda Ponder, am acting as scribe for Serena Croissant, MD.  I have reviewed the above documentation for accuracy and completeness, and I agree with the above.

## 2020-08-21 ENCOUNTER — Telehealth: Payer: Self-pay | Admitting: Hematology and Oncology

## 2020-08-21 ENCOUNTER — Other Ambulatory Visit: Payer: Self-pay

## 2020-08-21 ENCOUNTER — Telehealth: Payer: Self-pay | Admitting: Gastroenterology

## 2020-08-21 ENCOUNTER — Inpatient Hospital Stay: Payer: BC Managed Care – PPO | Attending: Hematology and Oncology | Admitting: Hematology and Oncology

## 2020-08-21 DIAGNOSIS — Z800 Family history of malignant neoplasm of digestive organs: Secondary | ICD-10-CM

## 2020-08-21 DIAGNOSIS — D509 Iron deficiency anemia, unspecified: Secondary | ICD-10-CM | POA: Insufficient documentation

## 2020-08-21 DIAGNOSIS — Z8371 Family history of colonic polyps: Secondary | ICD-10-CM

## 2020-08-21 NOTE — Telephone Encounter (Signed)
The pt has been scheduled for 08/29/20 at 130 pm with Amy Esterwood PA.  Please call pt and give her appt date and time. Thank you.

## 2020-08-21 NOTE — Telephone Encounter (Signed)
Pt saw her oncologist today who referred pt to [place] for severe IDA. Pt wants to be seen soon. Dr. Christella Hartigan first available is not until Oct and APP in August. Pls call pt.

## 2020-08-21 NOTE — Telephone Encounter (Signed)
Can you call and set her up for the first available she can do with Dr Christella Hartigan or app?

## 2020-08-21 NOTE — Assessment & Plan Note (Signed)
05/30/20 showed RBC 4.39, HGB 11.6, HCT 36.4, MCV 82.9, PLT 363, Vit D 21, B12 337

## 2020-08-21 NOTE — Telephone Encounter (Signed)
Scheduled appointment per 07/11 los. Patient is aware.

## 2020-08-21 NOTE — Telephone Encounter (Signed)
Patient called stating she will be out of town from 08/26/20 through 09/02/2020.

## 2020-08-22 NOTE — Telephone Encounter (Signed)
Patient rescheduled for 09/18/2020.

## 2020-08-23 ENCOUNTER — Telehealth: Payer: Self-pay

## 2020-08-23 DIAGNOSIS — D509 Iron deficiency anemia, unspecified: Secondary | ICD-10-CM

## 2020-08-23 NOTE — Telephone Encounter (Signed)
Pt has been added to the wait list Order for CBC added and pt aware to come in a few days prior to the appt.

## 2020-08-23 NOTE — Telephone Encounter (Signed)
-----   Message from Rachael Fee, MD sent at 08/22/2020  8:58 PM EDT ----- Darrick Huntsman,  I think this is the Cassidy Shaffer you messaged about. Her Hb is 11-12, hematology note from OV today blamed heavy menstrual bleeding as cause of her IDA.  She declined an appt next week in our office and has one 8/8.  I'll put her on my wait list for a sooner appt, I think colonoscopy is probably a good idea.  [name], Can you put her on my wait list for sooner apt than 8/8.  A cbc the day prior would be helpful as well.  Thanks

## 2020-08-25 ENCOUNTER — Inpatient Hospital Stay: Payer: BC Managed Care – PPO

## 2020-08-25 ENCOUNTER — Other Ambulatory Visit: Payer: Self-pay

## 2020-08-25 VITALS — BP 128/86 | HR 61 | Temp 98.5°F | Resp 18

## 2020-08-25 DIAGNOSIS — D509 Iron deficiency anemia, unspecified: Secondary | ICD-10-CM

## 2020-08-25 MED ORDER — SODIUM CHLORIDE 0.9 % IV SOLN
Freq: Once | INTRAVENOUS | Status: AC
  Filled 2020-08-25: qty 250

## 2020-08-25 MED ORDER — SODIUM CHLORIDE 0.9 % IV SOLN
300.0000 mg | Freq: Once | INTRAVENOUS | Status: AC
  Administered 2020-08-25: 300 mg via INTRAVENOUS
  Filled 2020-08-25: qty 300

## 2020-08-25 NOTE — Patient Instructions (Signed)

## 2020-08-29 ENCOUNTER — Ambulatory Visit: Payer: BC Managed Care – PPO | Admitting: Physician Assistant

## 2020-09-05 ENCOUNTER — Other Ambulatory Visit: Payer: Self-pay

## 2020-09-05 ENCOUNTER — Inpatient Hospital Stay: Payer: BC Managed Care – PPO

## 2020-09-05 VITALS — BP 126/82 | HR 69 | Temp 98.1°F | Resp 16

## 2020-09-05 DIAGNOSIS — D509 Iron deficiency anemia, unspecified: Secondary | ICD-10-CM | POA: Diagnosis not present

## 2020-09-05 MED ORDER — SODIUM CHLORIDE 0.9 % IV SOLN
Freq: Once | INTRAVENOUS | Status: AC
  Filled 2020-09-05: qty 250

## 2020-09-05 MED ORDER — SODIUM CHLORIDE 0.9 % IV SOLN
300.0000 mg | Freq: Once | INTRAVENOUS | Status: AC
  Administered 2020-09-05: 300 mg via INTRAVENOUS
  Filled 2020-09-05: qty 300

## 2020-09-05 NOTE — Patient Instructions (Signed)

## 2020-09-12 ENCOUNTER — Other Ambulatory Visit: Payer: Self-pay

## 2020-09-12 ENCOUNTER — Inpatient Hospital Stay: Payer: BC Managed Care – PPO | Attending: Hematology and Oncology

## 2020-09-12 VITALS — BP 115/79 | HR 65 | Temp 98.6°F | Resp 18

## 2020-09-12 DIAGNOSIS — D509 Iron deficiency anemia, unspecified: Secondary | ICD-10-CM | POA: Diagnosis not present

## 2020-09-12 MED ORDER — SODIUM CHLORIDE 0.9 % IV SOLN
300.0000 mg | Freq: Once | INTRAVENOUS | Status: AC
  Administered 2020-09-12: 300 mg via INTRAVENOUS
  Filled 2020-09-12: qty 300

## 2020-09-12 MED ORDER — SODIUM CHLORIDE 0.9 % IV SOLN
Freq: Once | INTRAVENOUS | Status: AC
  Filled 2020-09-12: qty 250

## 2020-09-12 NOTE — Patient Instructions (Signed)

## 2020-09-14 DIAGNOSIS — D649 Anemia, unspecified: Secondary | ICD-10-CM | POA: Insufficient documentation

## 2020-09-18 ENCOUNTER — Ambulatory Visit (INDEPENDENT_AMBULATORY_CARE_PROVIDER_SITE_OTHER): Payer: BC Managed Care – PPO | Admitting: Nurse Practitioner

## 2020-09-18 ENCOUNTER — Encounter: Payer: Self-pay | Admitting: Nurse Practitioner

## 2020-09-18 VITALS — BP 110/70 | HR 109 | Ht 67.0 in | Wt 170.0 lb

## 2020-09-18 DIAGNOSIS — D509 Iron deficiency anemia, unspecified: Secondary | ICD-10-CM

## 2020-09-18 DIAGNOSIS — R1012 Left upper quadrant pain: Secondary | ICD-10-CM

## 2020-09-18 DIAGNOSIS — R109 Unspecified abdominal pain: Secondary | ICD-10-CM | POA: Diagnosis not present

## 2020-09-18 DIAGNOSIS — R1013 Epigastric pain: Secondary | ICD-10-CM

## 2020-09-18 NOTE — Progress Notes (Signed)
09/18/2020 Cassidy Shaffer [id number] Sep 28, 1995   Chief Complaint: Iron deficiency anemia   History of Present Illness: Cassidy Shaffer is a 25 year old female with a past medical history of gastritis and iron deficiency anemia.  She was initially seen in our office by Dr. Christella Hartigan 12/28/2019 due to having postprandial upper abdominal pain and indigestion.  She underwent an EGD 03/20/2020 showed a mild gastritis, no evidence of Helicobacter pylori.  The duodenum appeared normal.  She presents to our office today as recommended by hematologist Dr. Pamelia Hoit for further evaluation regarding iron deficiency anemia.  It was initially thought her IDA was secondary to menstrual irregularities with 3 menstrual cycles in a 6 week period of time. She reported her menstrual bleeding was not heavy but was more frequent.  She had menstrual spotting for 42 days which started in June, then had a menstrual cycle for 4 to 5 days every two weeks x 2. Her LMP was on 08/30/2020.  She was prescribed Progesterone to control her menstrual regularity but she did not wish to take it.  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  She complains of persistent fatigue for the past 6 months which has not improved since she received IV iron x 3 doses on 08/25/2020,  09/05/2020 and 09/12/2020.  She avoids NSAIDs.  Her most recent laboratory studies 08/21/2020 showed a hemoglobin level 11.6 ( Hg 13 on 07/21/2020 and Hg 11.6 on 05/30/2020). Hematocrit 37.6.  MCV 82.3.  Platelet 319.  Iron 32.  Iron saturation 7 and Ferritin 4 (filed under media).   She complains of having intermittent LUQ pain which radiates to the LLQ area. She describes feeling as if her left colon is on fire but also has a dull ache and sometimes a sharp pain to this area for the past 2 to 3 months.  She also has lower pelvic fullness/bloat.  No fever, sweats or chills.  No weight loss.  She typically passes a normal formed brown  bowel movement daily.  She had some diarrhea 3 months ago which was intermittent and resolved.  Father and paternal grandfather with history of colon polyps.  No known family history of celiac disease, IBD or colorectal cancer.  EGD 03/20/2020: - Minimal gastritis, biopsied to check for H. pylori. - The examination was otherwise normal. - MILD CHRONIC GASTRITIS. - WARTHIN-STARRY STAIN IS NEGATIVE FOR HELICOBACTER PYLORI.  CTAP with IV contrast 01/11/2020: SOLID VISCERA:  Liver: Normal.  Gallbladder: Normal.  Pancreas: Normal.  Adrenal glands: Normal.  Spleen: Normal.  Kidneys: Normal.   GI:  No bowel obstruction.  No focal bowel wall thickening or inflammatory changes.  The appendix is seen in the right lower quadrant and is normal-appearing.   PERITONEAL CAVITY/RETROPERITONEUM:  Trace free pelvic fluid is physiologic in a female patient this age.  No pneumoperitoneum.  No lymphadenopathy.  Aorta, IVC, iliac arteries, and major visceral arteries are grossly normal.   PELVIS:  No acute abnormalities. Cystic follicles are present on both ovaries.   MUSCULOSKELETAL:  No acute or destructive osseous processes.    Current Outpatient Medications on File Prior to Visit  Medication Sig Dispense Refill   Vitamin D, Ergocalciferol, (DRISDOL) 1.25 MG (50000 UNIT) CAPS capsule Take 1 capsule (50,000 Units total) by mouth every 7 (seven) days. 12 capsule 0   No current facility-administered medications on file prior to visit.   No Known Allergies  Current Medications, Allergies, Past Medical History, Past Surgical History, Family History and Social History were reviewed in [employer] record.   Review of Systems:   Constitutional: + Fatigue. Negative for fever, sweats, chills or weight loss.  Respiratory: Negative for shortness of breath.   Cardiovascular: Negative for chest pain, palpitations and leg swelling.  Gastrointestinal: See HPI.   Musculoskeletal: Negative for back pain or muscle aches.  Neurological: Negative for dizziness, headaches or paresthesias.  GYN: see HPI. LMP 08/30/2000.  Patient aware there can be no chance of pregnancy at the time of her colonoscopy.  Physical Exam: BP 110/70   Pulse (!) 109   Ht 5\' 7"  (1.702 m)   Wt 170 lb (77.1 kg)   SpO2 98%   BMI 26.63 kg/m  General: 25 year old female in no acute distress. Head: Normocephalic and atraumatic. Eyes: No scleral icterus. Conjunctiva pink . Ears: Normal auditory acuity. Mouth: Dentition intact. No ulcers or lesions.  Lungs: Clear throughout to auscultation. Heart: Regular rate and rhythm, no murmur. Abdomen: Soft, nontender and nondistended. No masses or hepatomegaly. Normal bowel sounds x 4 quadrants.  Rectal: Deferred.  Musculoskeletal: Symmetrical with no gross deformities. Extremities: No edema. Neurological: Alert oriented x 4. No focal deficits.  Psychological: Alert and cooperative. Normal mood and affect  Assessment and Recommendations:  25 year old female with IDA associated with menstrual irregularity in setting of intermittent left sided abdominal pain x 2 to 3 months. S/P IV iron x 3. Fatigue x 6 months. CTAP 12/2019 was unrevealing.  -CBC and iron levels per Dr. 01/2020. Recommend checking IgA and TTG level with next lab draw -Diagnostic colonoscopy, rule out IBD, less likely colon malignancy. Colonoscopy benefits and risks discussed including risk with sedation, risk of bleeding, perforation and infection  -Repeat CTAP if significant abdominal pain occurs  -Patient declined prescription for Dicyclomine -Patient to call our office if diarrhea recurs -Further recommendations to be determined after the above evaluation completed  Mild gastritis confirmed by EGD 03/2020, no evidence of H. Pylori. No current reflux symptoms.

## 2020-09-18 NOTE — Patient Instructions (Addendum)
If you are age 25 or younger, your body mass index should be between 19-25. Your Body mass index is 26.63 kg/m. If this is out of the aformentioned range listed, please consider follow up with your Primary Care Provider.   The [facility] providers would like to encourage you to use [facility] to communicate with providers for non-urgent requests or questions.  Due to long hold times on the telephone, sending your provider a message by [facility] may be faster and more efficient way to get a response. Please allow 48 business hours for a response.  Please remember that this is for non-urgent requests/questions.  PROCEDURES: You have been scheduled for a colonoscopy. Please follow the written instructions given to you at your visit today. If you use inhalers (even only as needed), please bring them with you on the day of your procedure.  We have given you printed ordered for blood work to be done at [facility].  Please call our office if your symptoms worsen.  It was great seeing you today! Thank you for entrusting me with your care and choosing [facility].  Arnaldo Natal, CRNP

## 2020-09-19 NOTE — Progress Notes (Signed)
I agree with the above note, plan 

## 2020-09-30 ENCOUNTER — Other Ambulatory Visit: Payer: Self-pay | Admitting: Nurse Practitioner

## 2020-09-30 DIAGNOSIS — E559 Vitamin D deficiency, unspecified: Secondary | ICD-10-CM

## 2020-10-03 ENCOUNTER — Ambulatory Visit (AMBULATORY_SURGERY_CENTER): Payer: BC Managed Care – PPO | Admitting: Gastroenterology

## 2020-10-03 ENCOUNTER — Other Ambulatory Visit: Payer: Self-pay

## 2020-10-03 ENCOUNTER — Encounter: Payer: Self-pay | Admitting: Gastroenterology

## 2020-10-03 VITALS — BP 110/68 | HR 62 | Temp 99.1°F | Resp 13 | Ht 67.0 in | Wt 170.0 lb

## 2020-10-03 DIAGNOSIS — D509 Iron deficiency anemia, unspecified: Secondary | ICD-10-CM

## 2020-10-03 MED ORDER — SODIUM CHLORIDE 0.9 % IV SOLN: 500.0000 mL | Freq: Once | INTRAVENOUS | Status: DC

## 2020-10-03 NOTE — Progress Notes (Signed)
HPI: This is a woman with ID   ROS: complete GI ROS as described in HPI, all other review negative.  Constitutional:  No unintentional weight loss   Past Medical History:  Diagnosis Date   Allergy    Epigastric pain    GERD (gastroesophageal reflux disease)    Hyperlipidemia    borderline    Hypertension    past hx in nursing school     Past Surgical History:  Procedure Laterality Date   WISDOM TOOTH EXTRACTION      Current Outpatient Medications  Medication Sig Dispense Refill   Vitamin D, Ergocalciferol, (DRISDOL) 1.25 MG (50000 UNIT) CAPS capsule TAKE 1 CAPSULE (50,000 UNITS TOTAL) BY MOUTH EVERY 7 (SEVEN) DAYS 12 capsule 0   Current Facility-Administered Medications  Medication Dose Route Frequency Provider Last Rate Last Admin   0.9 %  sodium chloride infusion  500 mL Intravenous Once Rachael Fee, MD        Allergies as of 10/03/2020   (No Known Allergies)    Family History  Problem Relation Age of Onset   Hyperlipidemia Mother    Osteoporosis Mother    Fibromyalgia Mother    Hypertension Father    Esophagitis Father    Hyperlipidemia Father    Colon polyps Father    Hypertension Maternal Grandmother    Diabetes Mellitus II Maternal Grandfather    COPD Maternal Grandfather    Hyperlipidemia Maternal Grandfather    Hypertension Maternal Grandfather    Hypertension Paternal Grandmother    Hyperlipidemia Paternal Grandmother    Hypertension Paternal Grandfather    Stroke Paternal Grandfather    Hyperlipidemia Paternal Grandfather    Heart attack Paternal Grandfather    Colon polyps Paternal Grandfather    Pancreatic cancer Maternal Great-grandfather    Colon cancer Neg Hx    Esophageal cancer Neg Hx    Rectal cancer Neg Hx    Stomach cancer Neg Hx     Social History   Socioeconomic History   Marital status: Single    Spouse name: Not on file   Number of children: Not on file   Years of education: Not on file   Highest education level:  Not on file  Occupational History   Not on file  Tobacco Use   Smoking status: Never   Smokeless tobacco: Never  Vaping Use   Vaping Use: Never used  Substance and Sexual Activity   Alcohol use: Never   Drug use: Never   Sexual activity: Never  Other Topics Concern   Not on file  Social History Narrative   Not on file   Social Determinants of Health   Financial Resource Strain: Not on file  Food Insecurity: Not on file  Transportation Needs: Not on file  Physical Activity: Not on file  Stress: Not on file  Social Connections: Not on file  Intimate Partner Violence: Not on file     Physical Exam: BP (!) 108/58   Pulse 72   Temp 99.1 F (37.3 C)   Resp 18   Ht 5\' 7"  (1.702 m)   Wt 170 lb (77.1 kg)   SpO2 100%   BMI 26.63 kg/m  Constitutional: generally well-appearing Psychiatric: alert and oriented x3 Lungs: CTA bilaterally Heart: no MCR  Assessment and plan: 25 y.o. female with ID  For colonoscoy today  Care is appropriate for the ambulatory setting.  25, MD [facility] 10/03/2020, 10:31 AM

## 2020-10-03 NOTE — Op Note (Signed)
[facility] Patient Name: Cassidy Shaffer Procedure Date: 10/03/2020 10:07 AM MRN: [id number] Endoscopist: Rachael Fee , MD Age: 25 Referring MD:  Date of Birth: 07-05-1995 Gender: Female Account #: [id number] Procedure:                Colonoscopy Indications:              Iron deficiency Medicines:                Monitored Anesthesia Care Procedure:                Pre-Anesthesia Assessment:                           - Prior to the procedure, a History and Physical                            was performed, and patient medications and                            allergies were reviewed. The patient's tolerance of                            previous anesthesia was also reviewed. The risks                            and benefits of the procedure and the sedation                            options and risks were discussed with the patient.                            All questions were answered, and informed consent                            was obtained. Prior Anticoagulants: The patient has                            taken no previous anticoagulant or antiplatelet                            agents. ASA Grade Assessment: II - A patient with                            mild systemic disease. After reviewing the risks                            and benefits, the patient was deemed in                            satisfactory condition to undergo the procedure.                           After obtaining informed consent, the colonoscope  was passed under direct vision. Throughout the                            procedure, the patient's blood pressure, pulse, and                            oxygen saturations were monitored continuously. The                            Colonoscope was introduced through the anus and                            advanced to the the cecum, identified by                            appendiceal orifice and ileocecal valve. The                             colonoscopy was performed without difficulty. The                            patient tolerated the procedure well. The quality                            of the bowel preparation was good. The ileocecal                            valve, appendiceal orifice, and rectum were                            photographed. Scope In: 10:18:32 AM Scope Out: 10:29:27 AM Scope Withdrawal Time: 0 hours 8 minutes 37 seconds  Total Procedure Duration: 0 hours 10 minutes 55 seconds  Findings:                 The entire examined colon appeared normal on direct                            and retroflexion views. Complications:            No immediate complications. Estimated blood loss:                            None. Estimated Blood Loss:     Estimated blood loss: none. Impression:               - The entire examined colon is normal on direct and                            retroflexion views.                           - No polyps or cancers. Recommendation:           - Patient has a contact number available for  emergencies. The signs and symptoms of potential                            delayed complications were discussed with the                            patient. Return to normal activities tomorrow.                            Written discharge instructions were provided to the                            patient.                           - Resume previous diet.                           - Continue present medications.                           - Repeat colonoscopy at age 34 for screening. Rachael Fee, MD 10/03/2020 10:31:20 AM This report has been signed electronically.

## 2020-10-03 NOTE — Progress Notes (Signed)
To pacu, VSS. Report to [email or web address] 

## 2020-10-03 NOTE — Progress Notes (Signed)
VS by [place]  Pt's states no medical or surgical changes since previsit or office visit.  

## 2020-10-03 NOTE — Patient Instructions (Signed)
YOU HAD AN ENDOSCOPIC PROCEDURE TODAY AT THE [facility]:   Refer to the procedure report that was given to you for any specific questions about what was found during the examination.  If the procedure report does not answer your questions, please call your gastroenterologist to clarify.  If you requested that your care partner not be given the details of your procedure findings, then the procedure report has been included in a sealed envelope for you to review at your convenience later.  YOU SHOULD EXPECT: Some feelings of bloating in the abdomen. Passage of more gas than usual.  Walking can help get rid of the air that was put into your GI tract during the procedure and reduce the bloating. If you had a lower endoscopy (such as a colonoscopy or flexible sigmoidoscopy) you may notice spotting of blood in your stool or on the toilet paper. If you underwent a bowel prep for your procedure, you may not have a normal bowel movement for a few days.  Please Note:  You might notice some irritation and congestion in your nose or some drainage.  This is from the oxygen used during your procedure.  There is no need for concern and it should clear up in a day or so.  SYMPTOMS TO REPORT IMMEDIATELY:   Following lower endoscopy (colonoscopy or flexible sigmoidoscopy):  Excessive amounts of blood in the stool  Significant tenderness or worsening of abdominal pains  Swelling of the abdomen that is new, acute  Fever of 100F or higher  For urgent or emergent issues, a gastroenterologist can be reached at any hour by calling [phone]. Do not use MyChart messaging for urgent concerns.    DIET:  We do recommend a small meal at first, but then you may proceed to your regular diet.  Drink plenty of fluids but you should avoid alcoholic beverages for 24 hours.  ACTIVITY:  You should plan to take it easy for the rest of today and you should NOT DRIVE or use heavy machinery until tomorrow (because  of the sedation medicines used during the test).    FOLLOW UP: Our staff will call the number listed on your records 48-72 hours following your procedure to check on you and address any questions or concerns that you may have regarding the information given to you following your procedure. If we do not reach you, we will leave a message.  We will attempt to reach you two times.  During this call, we will ask if you have developed any symptoms of COVID 19. If you develop any symptoms (ie: fever, flu-like symptoms, shortness of breath, cough etc.) before then, please call [phone].  If you test positive for Covid 19 in the 2 weeks post procedure, please call and report this information to us.    If any biopsies were taken you will be contacted by phone or by letter within the next 1-3 weeks.  Please call us at [phone] if you have not heard about the biopsies in 3 weeks.    SIGNATURES/CONFIDENTIALITY: You and/or your care partner have signed paperwork which will be entered into your electronic medical record.  These signatures attest to the fact that that the information above on your After Visit Summary has been reviewed and is understood.  Full responsibility of the confidentiality of this discharge information lies with you and/or your care-partner. 

## 2020-10-05 ENCOUNTER — Telehealth: Payer: Self-pay | Admitting: *Deleted

## 2020-10-05 NOTE — Telephone Encounter (Signed)
  Follow up Call-  Call back number 10/03/2020 03/20/2020  Post procedure Call Back phone  # [phone] #[phone] cell  Permission to leave phone message Yes Yes  Some recent data might be hidden     Patient questions:  Do you have a fever, pain , or abdominal swelling? No. Pain Score  0 *  Have you tolerated food without any problems? Yes.    Have you been able to return to your normal activities? Yes.    Do you have any questions about your discharge instructions: Diet   No. Medications  No. Follow up visit  No.  Do you have questions or concerns about your Care? No.  Actions: * If pain score is 4 or above: No action needed, pain <4.Have you developed a fever since your procedure? no  2.   Have you had an respiratory symptoms (SOB or cough) since your procedure? no  3.   Have you tested positive for COVID 19 since your procedure no  4.   Have you had any family members/close contacts diagnosed with the COVID 19 since your procedure?  no   If yes to any of these questions please route to Laverna Peace, RN and Karlton Lemon, RN

## 2020-10-13 ENCOUNTER — Encounter: Payer: Self-pay | Admitting: Hematology and Oncology

## 2020-10-17 LAB — TISSUE TRANSGLUTAMINASE ABS,IGG,IGA
(tTG) Ab, IgA: <1 U/mL
(tTG) Ab, IgG: <1 U/mL

## 2020-10-21 NOTE — Progress Notes (Signed)
HEMATOLOGY-ONCOLOGY [facility] VIDEO VISIT PROGRESS NOTE  I connected with Cassidy Shaffer on 10/23/2020 at  8:30 AM EDT by MyChart video conference and verified that I am speaking with the correct person using two identifiers.  I discussed the limitations, risks, security and privacy concerns of performing an evaluation and management service by MyChart and the availability of in person appointments.  I also discussed with the patient that there may be a patient responsible charge related to this service. The patient expressed understanding and agreed to proceed.  Patient's Location: Home Physician Location: Clinic  CHIEF COMPLIANT: Follow-up of IDA  INTERVAL HISTORY: Cassidy Shaffer is a 25 y.o. female with above-mentioned history of IDA. She presents via MyChart today for follow-up and to review her recent labs.  She tells me after the iron infusion she felt much worse but then slowly starting to feel better currently.  She is still not certain if the iron infusion helped her.  She still feels extremely tired and weak as well as muscle and joint pains and discomfort.  Observations/Objective:  There were no vitals filed for this visit. There is no height or weight on file to calculate BMI.  I have reviewed the data as listed CMP Latest Ref Rng & Units 05/30/2020 12/01/2019 11/16/2019  Glucose 65 - 139 mg/dL 86 80 90  BUN 7 - 25 mg/dL 11 11 7   Creatinine 0.50 - 1.10 mg/dL 4.40 1.02  Sodium 135 - 146 mmol/L 139 138 136  Potassium 3.5 - 5.3 mmol/L 4.1 4.1 3.6  Chloride 98 - 110 mmol/L 106 104 105  CO2 20 - 32 mmol/L 27 28 23   Calcium 8.6 - 10.2 mg/dL 9.3 9.3 9.0  Total Protein 6.1 - 8.1 g/dL 6.8 6.6 -  Total Bilirubin 0.2 - 1.2 mg/dL 0.2 0.4 -  AST 10 - 30 U/L 13 16 -  ALT 6 - 29 U/L 14 15 -    Lab Results  Component Value Date   WBC 6.1 05/30/2020   HGB 11.6 (L) 05/30/2020   HCT 36.4 05/30/2020   MCV 82.9 05/30/2020   PLT 363 05/30/2020   NEUTROABS 3,764 05/30/2020       Assessment Plan:  Iron deficiency anemia 05/30/20 showed RBC 4.39, HGB 11.6, HCT 36.4, MCV 82.9, PLT 363, Vit D 21, B12 337 07/21/2020: Hemoglobin 13, platelets 351, MCV 86, creatinine 0.81, ferritin 3, iron saturation 6%, TIBC 419   Patient has profound iron deficiency with severe symptoms of fatigue and generalized weakness. This is been ongoing since January 2022 Most likely etiology: Heavy uterine bleeding (this is much improved)  IV iron: July 2022 Lab review: 10/13/2020: Celiac antibodies: Negative, hemoglobin 12.8, MCV 86.6, RDW 16.1, platelets 270, iron saturation 28%, ferritin 56 Based on her labs she does not need any IV iron at this time. Fatigue: Has not improved significantly.  She will discuss with her primary care physician if there is any other reason for the fatigue. Muscle aches and pains: Instructed to try magnesium supplementation. She currently takes vitamin D 50,000 units.  Return to clinic with a MyChart virtual visit in 3 months.  Labs to be done at [facility].  I discussed the assessment and treatment plan with the patient. The patient was provided an opportunity to ask questions and all were answered. The patient agreed with the plan and demonstrated an understanding of the instructions. The patient was advised to call back or seek an in-person evaluation if the symptoms worsen or if the  condition fails to improve as anticipated.   Total time spent: 20 minutes including face-to-face MyChart video visit time and time spent for planning, charting and coordination of care  Sabas Sous, MD 10/23/2020  I, Alda Ponder am acting as scribe for Serena Croissant, MD.  I have reviewed the above documentation for accuracy and completeness, and I agree with the above.

## 2020-10-23 ENCOUNTER — Telehealth (HOSPITAL_BASED_OUTPATIENT_CLINIC_OR_DEPARTMENT_OTHER): Payer: BC Managed Care – PPO | Admitting: Hematology and Oncology

## 2020-10-23 ENCOUNTER — Telehealth: Payer: Self-pay

## 2020-10-23 DIAGNOSIS — D509 Iron deficiency anemia, unspecified: Secondary | ICD-10-CM | POA: Diagnosis not present

## 2020-10-23 NOTE — Assessment & Plan Note (Addendum)
05/30/20 showed RBC 4.39, HGB 11.6, HCT 36.4, MCV 82.9, PLT 363, Vit D 21, B12 337 07/21/2020: Hemoglobin 13, platelets 351, MCV 86, creatinine 0.81, ferritin 3, iron saturation 6%, TIBC 419  Patient has profound iron deficiency with severe symptoms of fatigue and generalized weakness. This is been ongoing since January 2022 Most likely etiology: Heavy uterine bleeding  IV iron: July 2022 Lab review: 10/13/2020: Celiac antibodies: Negative

## 2020-10-23 NOTE — Telephone Encounter (Signed)
Patient called requesting that we contact [facility] states they need a dx code for her Vitamin D lab test. [phone]. Spoke with Daysha who states she resubmitted the claim using E55.9 reports it takes 30-45 business days for a new bill to be generated.

## 2020-10-24 ENCOUNTER — Ambulatory Visit: Payer: BC Managed Care – PPO | Admitting: Gastroenterology

## 2021-01-18 ENCOUNTER — Encounter: Payer: Self-pay | Admitting: Hematology and Oncology

## 2021-02-15 ENCOUNTER — Encounter: Payer: Self-pay | Admitting: Nurse Practitioner

## 2021-02-15 ENCOUNTER — Other Ambulatory Visit: Payer: Self-pay

## 2021-02-15 ENCOUNTER — Ambulatory Visit: Payer: BC Managed Care – PPO | Admitting: Nurse Practitioner

## 2021-02-15 VITALS — BP 122/74 | HR 103 | Temp 97.5°F | Ht 67.0 in | Wt 172.0 lb

## 2021-02-15 DIAGNOSIS — N926 Irregular menstruation, unspecified: Secondary | ICD-10-CM | POA: Diagnosis not present

## 2021-02-15 DIAGNOSIS — E559 Vitamin D deficiency, unspecified: Secondary | ICD-10-CM

## 2021-02-15 DIAGNOSIS — R5383 Other fatigue: Secondary | ICD-10-CM

## 2021-02-15 DIAGNOSIS — R350 Frequency of micturition: Secondary | ICD-10-CM | POA: Diagnosis not present

## 2021-02-15 DIAGNOSIS — Z8742 Personal history of other diseases of the female genital tract: Secondary | ICD-10-CM | POA: Diagnosis not present

## 2021-02-15 DIAGNOSIS — Z8616 Personal history of COVID-19: Secondary | ICD-10-CM

## 2021-02-15 DIAGNOSIS — R102 Pelvic and perineal pain unspecified side: Secondary | ICD-10-CM

## 2021-02-15 LAB — POCT URINALYSIS DIPSTICK
Bilirubin, UA: NEGATIVE
Blood, UA: NEGATIVE
Glucose, UA: NEGATIVE
Ketones, UA: NEGATIVE
Leukocytes, UA: NEGATIVE
Nitrite, UA: NEGATIVE
Protein, UA: NEGATIVE
Spec Grav, UA: 1.01 (ref 1.010–1.025)
Urobilinogen, UA: 0.2 E.U./dL
pH, UA: 7 (ref 5.0–8.0)

## 2021-02-15 LAB — POCT URINE PREGNANCY: Preg Test, Ur: NEGATIVE

## 2021-02-15 NOTE — Progress Notes (Signed)
Acute Office Visit  Subjective:    Patient ID: Cassidy Shaffer, female    DOB: Mar 07, 1995, 26 y.o.   MRN: [id number]  Chief Complaint  Patient presents with   Urinary symptoms    HPI: Patient is in today for Urinary symptoms  She reports new onset urinary frequency, nausea, pelvic discomfort, and right flank pain. The current episode started a few days ago and staying the same. Treatment has included pushing fluids.Patient states symptoms are mild in intensity, occurring intermittently. She has not been recently treated for similar symptoms. She added she has experienced chills, fatigue, and irregular menstrual cycles. Denies sexually activity. She had COVID-19 early December 2022.   Associated symptoms: Yes abdominal pain Yes back pain  Yes chills No constipation  No cramping No diarrhea  No discharge No fever  No hematuria No nausea  No vomiting      Past Medical History:  Diagnosis Date   Allergy    Epigastric pain    GERD (gastroesophageal reflux disease)    Hyperlipidemia    borderline    Hypertension    past hx in nursing school     Past Surgical History:  Procedure Laterality Date   WISDOM TOOTH EXTRACTION      Family History  Problem Relation Age of Onset   Hyperlipidemia Mother    Osteoporosis Mother    Fibromyalgia Mother    Hypertension Father    Esophagitis Father    Hyperlipidemia Father    Colon polyps Father    Hypertension Maternal Grandmother    Diabetes Mellitus II Maternal Grandfather    COPD Maternal Grandfather    Hyperlipidemia Maternal Grandfather    Hypertension Maternal Grandfather    Hypertension Paternal Grandmother    Hyperlipidemia Paternal Grandmother    Hypertension Paternal Grandfather    Stroke Paternal Grandfather    Hyperlipidemia Paternal Grandfather    Heart attack Paternal Grandfather    Colon polyps Paternal Grandfather    Pancreatic cancer Maternal Great-grandfather    Colon cancer Neg Hx    Esophageal cancer  Neg Hx    Rectal cancer Neg Hx    Stomach cancer Neg Hx     Social History   Socioeconomic History   Marital status: Single    Spouse name: Not on file   Number of children: Not on file   Years of education: Not on file   Highest education level: Not on file  Occupational History   Not on file  Tobacco Use   Smoking status: Never   Smokeless tobacco: Never  Vaping Use   Vaping Use: Never used  Substance and Sexual Activity   Alcohol use: Never   Drug use: Never   Sexual activity: Never  Other Topics Concern   Not on file  Social History Narrative   Not on file   Social Determinants of Health   Financial Resource Strain: Not on file  Food Insecurity: Not on file  Transportation Needs: Not on file  Physical Activity: Not on file  Stress: Not on file  Social Connections: Not on file  Intimate Partner Violence: Not on file    Outpatient Medications Prior to Visit  Medication Sig Dispense Refill   Vitamin D, Ergocalciferol, (DRISDOL) 1.25 MG (50000 UNIT) CAPS capsule TAKE 1 CAPSULE (50,000 UNITS TOTAL) BY MOUTH EVERY 7 (SEVEN) DAYS 12 capsule 0   No facility-administered medications prior to visit.    No Known Allergies  Review of Systems  Constitutional:  Positive for chills.  Negative for fever.  HENT:  Negative for congestion and sore throat.   Respiratory:  Negative for cough and shortness of breath.   Cardiovascular:  Negative for chest pain.  Gastrointestinal:  Negative for abdominal pain, nausea and vomiting.  Genitourinary:  Positive for frequency, pelvic pain and urgency. Negative for dysuria and flank pain.  Musculoskeletal:  Negative for back pain.      Objective:    Physical Exam Vitals reviewed.  Constitutional:      Appearance: Normal appearance.  HENT:     Head: Normocephalic.     Right Ear: There is impacted cerumen.     Left Ear: There is impacted cerumen.     Nose: Nose normal.     Mouth/Throat:     Mouth: Mucous membranes are moist.   Cardiovascular:     Rate and Rhythm: Normal rate and regular rhythm.     Pulses: Normal pulses.     Heart sounds: Normal heart sounds.  Pulmonary:     Effort: Pulmonary effort is normal.     Breath sounds: Normal breath sounds.  Abdominal:     General: Bowel sounds are normal.     Palpations: Abdomen is soft.     Tenderness: There is right CVA tenderness.  Musculoskeletal:        General: Normal range of motion.  Skin:    General: Skin is warm and dry.     Capillary Refill: Capillary refill takes less than 2 seconds.  Neurological:     General: No focal deficit present.     Mental Status: She is alert and oriented to person, place, and time.  Psychiatric:        Mood and Affect: Mood normal.        Behavior: Behavior normal.     Wt Readings from Last 3 Encounters:  10/03/20 170 lb (77.1 kg)  09/18/20 170 lb (77.1 kg)  08/21/20 166 lb 3.2 oz (75.4 kg)    Health Maintenance Due  Topic Date Due   HPV VACCINES (1 - 2-dose series) Never done   PAP-Cervical Cytology Screening  Never done   PAP SMEAR-Modifier  Never done   INFLUENZA VACCINE  09/11/2020       Topic Date Due   HPV VACCINES (1 - 2-dose series) Never done     Lab Results  Component Value Date   TSH 2.55 05/30/2020   Lab Results  Component Value Date   WBC 6.1 05/30/2020   HGB 11.6 (L) 05/30/2020   HCT 36.4 05/30/2020   MCV 82.9 05/30/2020   PLT 363 05/30/2020   Lab Results  Component Value Date   NA 139 05/30/2020   K 4.1 05/30/2020   CO2 27 05/30/2020   GLUCOSE 86 05/30/2020   BUN 11 05/30/2020   CREATININE 0.67 05/30/2020   BILITOT 0.2 05/30/2020   AST 13 05/30/2020   ALT 14 05/30/2020   PROT 6.8 05/30/2020   CALCIUM 9.3 05/30/2020   ANIONGAP 8 11/16/2019      Assessment & Plan:  1. Pelvic pain - POCT urinalysis dipstick - POCT urine pregnancy - CBC with Differential/Platelet - Comprehensive metabolic panel  2. Irregular periods/menstrual cycles - POCT urinalysis dipstick -  POCT urine pregnancy  3. Urinary frequency - POCT urinalysis dipstick - POCT urine pregnancy - CBC with Differential/Platelet - Comprehensive metabolic panel  4. History of ovarian cyst - CBC with Differential/Platelet  5. Vitamin D deficiency - Vitamin D, 25-hydroxy  6. Fatigue, unspecified type - Vitamin  D, 25-hydroxy - CBC with Differential/Platelet - Comprehensive metabolic panel - SARS-CoV-2 Semi-Quantitative Total Antibody, Spike-(per pt request)  7. History of COVID-19 - CBC with Differential/Platelet - Comprehensive metabolic panel - SARS-CoV-2 Semi-Quantitative Total Antibody, Spike    Rest and push fluids Take Ibuprofen as directed We call you with lab results    Follow-up: PRN  An After Visit Summary was printed and given to the patient.  I, Rip Harbour, NP, have reviewed all documentation for this visit. The documentation on 02/15/21 for the exam, diagnosis, procedures, and orders are all accurate and complete.   Signed, Rip Harbour, NP [place] [phone]

## 2021-02-15 NOTE — Patient Instructions (Addendum)
Rest and push fluids Take Ibuprofen as directed We call you with lab results    Pelvic Pain, Female Pelvic pain is pain in your lower belly (abdomen), below your belly button and between your hips. The pain may: Start all of a sudden (be acute).                                                                            Pelvic pain that lasts longer than 6 months is called chronic pelvic pain. There are many causes of pelvic pain. Sometimes the cause of pelvic pain is not known. Follow these instructions at home:  Take over-the-counter and prescription medicines only as told by your doctor. Rest as told by your doctor. Do not have sex if it hurts. Keep a journal of your pelvic pain. Write down: When the pain started. Where the pain is located. What seems to make the pain better or worse, such as food or your monthly period (menstrual cycle). Any symptoms you have along with the pain. Keep all follow-up visits. Contact a doctor if: Medicine does not help your pain, or your pain comes back. You have new symptoms. You have unusual discharge or bleeding from your vagina. You have a fever or chills. You are having trouble pooping (constipation). You have blood in your pee (urine) or poop (stool). Your pee smells bad. You feel weak or light-headed. Get help right away if: You have sudden pain that is very bad. You have very bad pain and also have any of these symptoms: A fever. Feeling like you may vomit (nauseous). Vomiting. Being very sweaty. You faint. These symptoms may be an emergency. Get help right away. Call your local emergency services (911 in the [place] Do not wait to see if the symptoms will go away. Do not drive yourself to the hospital. Summary Pelvic pain is pain in your lower belly (abdomen), below your belly button and between your hips. There are many causes of pelvic pain. Keep a journal of your pelvic pain. This information is not intended to replace advice  given to you by your health care provider. Make sure you discuss any questions you have with your health care provider. Document Revised: 06/06/2020 Document Reviewed: 06/06/2020 Elsevier Patient Education  2022 [employer].

## 2021-02-16 LAB — COMPREHENSIVE METABOLIC PANEL
ALT: 18 IU/L (ref 0–32)
AST: 18 IU/L (ref 0–40)
Albumin/Globulin Ratio: 1.9 (ref 1.2–2.2)
Albumin: 4.4 g/dL (ref 3.9–5.0)
Alkaline Phosphatase: 70 IU/L (ref 44–121)
BUN/Creatinine Ratio: 12 (ref 9–23)
BUN: 9 mg/dL (ref 6–20)
Bilirubin Total: 0.2 mg/dL (ref 0.0–1.2)
CO2: 23 mmol/L (ref 20–29)
Calcium: 9.2 mg/dL (ref 8.7–10.2)
Chloride: 105 mmol/L (ref 96–106)
Creatinine, Ser: 0.74 mg/dL (ref 0.57–1.00)
Globulin, Total: 2.3 g/dL (ref 1.5–4.5)
Glucose: 89 mg/dL (ref 70–99)
Potassium: 4.4 mmol/L (ref 3.5–5.2)
Sodium: 140 mmol/L (ref 134–144)
Total Protein: 6.7 g/dL (ref 6.0–8.5)
eGFR: 115 mL/min/{1.73_m2} (ref >59)

## 2021-02-16 LAB — CBC WITH DIFFERENTIAL/PLATELET
Basophils Absolute: 0.1 10*3/uL (ref 0.0–0.2)
Basos: 1 %
EOS (ABSOLUTE): 0.1 10*3/uL (ref 0.0–0.4)
Eos: 2 %
Hematocrit: 42.9 % (ref 34.0–46.6)
Hemoglobin: 14.5 g/dL (ref 11.1–15.9)
Immature Grans (Abs): 0 10*3/uL (ref 0.0–0.1)
Immature Granulocytes: 0 %
Lymphocytes Absolute: 1.5 10*3/uL (ref 0.7–3.1)
Lymphs: 22 %
MCH: 30.7 pg (ref 26.6–33.0)
MCHC: 33.8 g/dL (ref 31.5–35.7)
MCV: 91 fL (ref 79–97)
Monocytes Absolute: 0.6 10*3/uL (ref 0.1–0.9)
Monocytes: 9 %
Neutrophils Absolute: 4.6 10*3/uL (ref 1.4–7.0)
Neutrophils: 66 %
Platelets: 299 10*3/uL (ref 150–450)
RBC: 4.73 x10E6/uL (ref 3.77–5.28)
RDW: 11.7 % (ref 11.7–15.4)
WBC: 6.9 10*3/uL (ref 3.4–10.8)

## 2021-02-16 LAB — SARS-COV-2 SEMI-QUANTITATIVE TOTAL ANTIBODY, SPIKE: SARS-CoV-2 Spike Ab Interp: POSITIVE

## 2021-02-16 LAB — SARS-COV-2 SPIKE AB DILUTION: SARS-CoV-2 Spike Ab Dilution: 7990 U/mL (ref <0.8)

## 2021-02-27 ENCOUNTER — Telehealth: Payer: Self-pay | Admitting: Hematology and Oncology

## 2021-02-27 NOTE — Telephone Encounter (Signed)
Scheduled appointment per 1/17 scheduling message. Patient is aware. °

## 2021-03-14 NOTE — Progress Notes (Signed)
Patient Care Team: Rip Harbour, NP as PCP - General (Nurse Practitioner)  DIAGNOSIS:    ICD-10-CM   1. Iron deficiency anemia, unspecified iron deficiency anemia type  D50.9       CHIEF COMPLIANT: Follow-up of IDA  INTERVAL HISTORY: Cassidy Shaffer is a 26 y.o. with above-mentioned history of IDA. She presents to the clinic today for follow-up.  She reports to be feeling quite well without any more significant fatigue.  Denies any signs or symptoms of iron deficiency.  Her menstrual cycles have lessened significantly.  ALLERGIES:  has No Known Allergies.  MEDICATIONS:  Current Outpatient Medications  Medication Sig Dispense Refill   cholecalciferol (VITAMIN D3) 25 MCG (1000 UNIT) tablet Take 2,000 Units by mouth daily.     No current facility-administered medications for this visit.    PHYSICAL EXAMINATION: ECOG PERFORMANCE STATUS: 1 - Symptomatic but completely ambulatory  Vitals:   03/15/21 0823  BP: 128/79  Pulse: 73  Temp: 98.1 F (36.7 C)  SpO2: 100%   Filed Weights   03/15/21 0823  Weight: 167 lb 9.6 oz (76 kg)    LABORATORY DATA:  I have reviewed the data as listed CMP Latest Ref Rng & Units 02/15/2021 05/30/2020 12/01/2019  Glucose 70 - 99 mg/dL 89 86 80  BUN 6 - 20 mg/dL 9 11 11   Creatinine 0.57 - 1.00 mg/dL 0.74 0.67 0.86  Sodium 134 - 144 mmol/L 140 139 138  Potassium 3.5 - 5.2 mmol/L 4.4 4.1 4.1  Chloride 96 - 106 mmol/L 105 106 104  CO2 20 - 29 mmol/L 23 27 28   Calcium 8.7 - 10.2 mg/dL 9.2 9.3 9.3  Total Protein 6.0 - 8.5 g/dL 6.7 6.8 6.6  Total Bilirubin 0.0 - 1.2 mg/dL 0.2 0.2 0.4  Alkaline Phos 44 - 121 IU/L 70 - -  AST 0 - 40 IU/L 18 13 16   ALT 0 - 32 IU/L 18 14 15     Lab Results  Component Value Date   WBC 6.9 02/15/2021   HGB 14.5 02/15/2021   HCT 42.9 02/15/2021   MCV 91 02/15/2021   PLT 299 02/15/2021   NEUTROABS 4.6 02/15/2021    ASSESSMENT & PLAN:  Iron deficiency anemia 05/30/20 showed RBC 4.39, HGB 11.6, HCT 36.4,  MCV 82.9, PLT 363, Vit D 21, B12 337 07/21/2020: Hemoglobin 13, platelets 351, MCV 86, creatinine 0.81, ferritin 3, iron saturation 6%, TIBC 419   Patient has profound iron deficiency with severe symptoms of fatigue and generalized weakness. This is been ongoing since January 2022 Most likely etiology: Heavy uterine bleeding (this is much improved)   IV iron: July 2022 Lab review: 10/13/2020: Celiac antibodies: Negative, hemoglobin 12.8, MCV 86.6, RDW 16.1, platelets 270, iron saturation 28%, ferritin 56 02/15/2021: Hemoglobin 14.5, MCV 91, ferritin 26, iron saturation 19%  Since the labs appear to be in good shape and has not had any further need for IV iron, I given a prescription for labs to be done at [facility] every 6 months. She will call us back if the ferritin drops below 10 or if the iron saturation drops below 10% or if the hemoglobin drops below 10.  We discussed the pros and cons of taking oral iron therapy.  She is concerned about gastritis.  If her ferritin starts to trend downward she will consider taking over-the-counter iron supplement. She will be seen on an as-needed basis   No orders of the defined types were placed in this encounter.  The patient has a good understanding of the overall plan. she agrees with it. she will call with any problems that may develop before the next visit here.  Total time spent: 20 mins including face to face time and time spent for planning, charting and coordination of care  Rulon Eisenmenger, MD, MPH 03/15/2021  I, Thana Ates, am acting as scribe for Dr. Nicholas Lose.  I have reviewed the above documentation for accuracy and completeness, and I agree with the above.

## 2021-03-15 ENCOUNTER — Other Ambulatory Visit: Payer: Self-pay

## 2021-03-15 ENCOUNTER — Inpatient Hospital Stay: Payer: BC Managed Care – PPO | Attending: Hematology and Oncology | Admitting: Hematology and Oncology

## 2021-03-15 DIAGNOSIS — D509 Iron deficiency anemia, unspecified: Secondary | ICD-10-CM | POA: Insufficient documentation

## 2021-03-15 NOTE — Assessment & Plan Note (Signed)
05/30/20 showed RBC 4.39, HGB 11.6, HCT 36.4, MCV 82.9, PLT 363, Vit D 21, B12 337 07/21/2020: Hemoglobin 13, platelets 351, MCV 86, creatinine 0.81, ferritin 3, iron saturation 6%, TIBC 419  Patient has profound iron deficiency with severe symptoms of fatigue and generalized weakness. This is been ongoing since January 2022 Most likely etiology: Heavy uterine bleeding (this is much improved)  IV iron: July 2022 Lab review: 10/13/2020: Celiac antibodies: Negative, hemoglobin 12.8, MCV 86.6, RDW 16.1, platelets 270, iron saturation 28%, ferritin 56 02/15/2021: Hemoglobin 14.5, MCV 91, rest of the CBC CMP are normal, iron studies were not done  Since the labs appear to be in good shape and has not had any further need for IV iron, patient can be seen on an as-needed basis.

## 2021-04-06 ENCOUNTER — Encounter: Payer: Self-pay | Admitting: Legal Medicine

## 2021-04-06 ENCOUNTER — Ambulatory Visit: Payer: BC Managed Care – PPO | Admitting: Legal Medicine

## 2021-04-06 VITALS — BP 100/80 | HR 80 | Temp 98.5°F | Resp 15 | Ht 67.0 in | Wt 164.0 lb

## 2021-04-06 DIAGNOSIS — Z9189 Other specified personal risk factors, not elsewhere classified: Secondary | ICD-10-CM | POA: Diagnosis not present

## 2021-04-06 DIAGNOSIS — R6889 Other general symptoms and signs: Secondary | ICD-10-CM | POA: Diagnosis not present

## 2021-04-06 DIAGNOSIS — J028 Acute pharyngitis due to other specified organisms: Secondary | ICD-10-CM | POA: Diagnosis not present

## 2021-04-06 NOTE — Progress Notes (Signed)
Acute Office Visit  Subjective:    Patient ID: Cassidy Shaffer, female    DOB: Apr 30, 1995, 26 y.o.   MRN: [id number]  Chief Complaint  Patient presents with   Fever   Headache    HPI: Patient is in today for cough, sore throat, fever and chills since 4 days ago. She did covid test at home it was negative.   Past Medical History:  Diagnosis Date   Allergy    Epigastric pain    GERD (gastroesophageal reflux disease)    Hyperlipidemia    borderline    Hypertension    past hx in nursing school     Past Surgical History:  Procedure Laterality Date   WISDOM TOOTH EXTRACTION      Family History  Problem Relation Age of Onset   Hyperlipidemia Mother    Osteoporosis Mother    Fibromyalgia Mother    Hypertension Father    Esophagitis Father    Hyperlipidemia Father    Colon polyps Father    Hypertension Maternal Grandmother    Diabetes Mellitus II Maternal Grandfather    COPD Maternal Grandfather    Hyperlipidemia Maternal Grandfather    Hypertension Maternal Grandfather    Hypertension Paternal Grandmother    Hyperlipidemia Paternal Grandmother    Hypertension Paternal Grandfather    Stroke Paternal Grandfather    Hyperlipidemia Paternal Grandfather    Heart attack Paternal Grandfather    Colon polyps Paternal Grandfather    Pancreatic cancer Maternal Great-grandfather    Colon cancer Neg Hx    Esophageal cancer Neg Hx    Rectal cancer Neg Hx    Stomach cancer Neg Hx     Social History   Socioeconomic History   Marital status: Single    Spouse name: Not on file   Number of children: Not on file   Years of education: Not on file   Highest education level: Not on file  Occupational History   Not on file  Tobacco Use   Smoking status: Never   Smokeless tobacco: Never  Vaping Use   Vaping Use: Never used  Substance and Sexual Activity   Alcohol use: Never   Drug use: Never   Sexual activity: Never  Other Topics Concern   Not on file  Social  History Narrative   Not on file   Social Determinants of Health   Financial Resource Strain: Not on file  Food Insecurity: Not on file  Transportation Needs: Not on file  Physical Activity: Not on file  Stress: Not on file  Social Connections: Not on file  Intimate Partner Violence: Not on file    Outpatient Medications Prior to Visit  Medication Sig Dispense Refill   cholecalciferol (VITAMIN D3) 25 MCG (1000 UNIT) tablet Take 2,000 Units by mouth daily.     No facility-administered medications prior to visit.    No Known Allergies  Review of Systems  Constitutional:  Positive for fatigue. Negative for chills and fever.  HENT:  Positive for sore throat. Negative for congestion and ear pain.   Respiratory:  Positive for cough. Negative for shortness of breath.   Cardiovascular:  Negative for chest pain and palpitations.  Gastrointestinal:  Negative for abdominal pain, constipation, diarrhea, nausea and vomiting.  Endocrine: Negative for polydipsia, polyphagia and polyuria.  Genitourinary:  Negative for difficulty urinating and dysuria.  Musculoskeletal:  Negative for arthralgias, back pain and myalgias.  Skin:  Negative for rash.  Neurological:  Positive for headaches.  Psychiatric/Behavioral:  Negative for dysphoric mood. The patient is not nervous/anxious.       Objective:    Physical Exam Vitals reviewed.  Constitutional:      General: She is in acute distress.     Appearance: She is well-developed.  HENT:     Head: Normocephalic.     Right Ear: Tympanic membrane normal.     Left Ear: Tympanic membrane normal.     Nose: Nose normal.     Mouth/Throat:     Mouth: Mucous membranes are moist.  Eyes:     Extraocular Movements: Extraocular movements intact.     Conjunctiva/sclera: Conjunctivae normal.     Pupils: Pupils are equal, round, and reactive to light.  Cardiovascular:     Rate and Rhythm: Normal rate and regular rhythm.     Pulses: Normal pulses.      Heart sounds: Normal heart sounds.  Pulmonary:     Effort: Pulmonary effort is normal. No respiratory distress.     Breath sounds: Normal breath sounds. No wheezing.  Abdominal:     General: Abdomen is flat. Bowel sounds are normal. There is no distension.     Palpations: Abdomen is soft.     Tenderness: There is no abdominal tenderness.  Musculoskeletal:     Cervical back: Normal range of motion.  Skin:    General: Skin is warm.     Capillary Refill: Capillary refill takes less than 2 seconds.  Neurological:     General: No focal deficit present.     Mental Status: She is alert and oriented to person, place, and time. Mental status is at baseline.    BP 100/80    Pulse 80    Temp 98.5 F (36.9 C)    Resp 15    Ht _0  (1.702 m)    Wt 164 lb (74.4 kg)    SpO2 99%    BMI 25.69 kg/m  Wt Readings from Last 3 Encounters:  04/06/21 164 lb (74.4 kg)  03/15/21 167 lb 9.6 oz (76 kg)  02/15/21 172 lb (78 kg)    Health Maintenance Due  Topic Date Due   HPV VACCINES (1 - 2-dose series) Never done   PAP-Cervical Cytology Screening  Never done   PAP SMEAR-Modifier  Never done       Topic Date Due   HPV VACCINES (1 - 2-dose series) Never done     Lab Results  Component Value Date   TSH 2.55 05/30/2020   Lab Results  Component Value Date   WBC 6.9 02/15/2021   HGB 14.5 02/15/2021   HCT 42.9 02/15/2021   MCV 91 02/15/2021   PLT 299 02/15/2021   Lab Results  Component Value Date   NA 140 02/15/2021   K 4.4 02/15/2021   CO2 23 02/15/2021   GLUCOSE 89 02/15/2021   BUN 9 02/15/2021   CREATININE 0.74 02/15/2021   BILITOT 0.2 02/15/2021   ALKPHOS 70 02/15/2021   AST 18 02/15/2021   ALT 18 02/15/2021   PROT 6.7 02/15/2021   ALBUMIN 4.4 02/15/2021   CALCIUM 9.2 02/15/2021   ANIONGAP 8 11/16/2019   EGFR 115 02/15/2021   No results found for: CHOL No results found for: HDL No results found for: LDLCALC No results found for: TRIG No results found for: CHOLHDL No  results found for: HGBA1C     Assessment & Plan:   Problem List Items Addressed This Visit   None Visit Diagnoses     At  increased risk of exposure to COVID-19 virus    -  Primary   Relevant Orders   Novel Coronavirus, NAA ([facility]) (Completed)- negative   Flu-like symptoms       Relevant Orders   Influenza A/B- negative   Pharyngitis due to other organism       Relevant Orders   POCT rapid strep A- negative Treat as viral, symptomatic treatment      No orders of the defined types were placed in this encounter.   Orders Placed This Encounter  Procedures   Novel Coronavirus, NAA ([facility])   POCT rapid strep A   Influenza A/B     Follow-up: Return if symptoms worsen or fail to improve.  An After Visit Summary was printed and given to the patient.  Reinaldo Meeker, MD [facility] [phone]

## 2021-04-07 LAB — NOVEL CORONAVIRUS, NAA: SARS-CoV-2, NAA: NOT DETECTED

## 2021-04-08 ENCOUNTER — Encounter: Payer: Self-pay | Admitting: Legal Medicine

## 2021-04-08 LAB — POCT INFLUENZA A/B
Influenza A, POC: NEGATIVE
Influenza B, POC: NEGATIVE

## 2021-04-08 LAB — POCT RAPID STREP A (OFFICE): Rapid Strep A Screen: NEGATIVE

## 2021-04-08 NOTE — Progress Notes (Signed)
Covid negative lp

## 2021-04-11 ENCOUNTER — Other Ambulatory Visit: Payer: Self-pay

## 2021-04-11 ENCOUNTER — Ambulatory Visit: Payer: BC Managed Care – PPO

## 2021-04-11 DIAGNOSIS — Z1152 Encounter for screening for COVID-19: Secondary | ICD-10-CM

## 2021-04-11 NOTE — Progress Notes (Signed)
Patient came in for a PCR covid test for a class next week.  ?

## 2021-04-12 LAB — NOVEL CORONAVIRUS, NAA: SARS-CoV-2, NAA: NOT DETECTED

## 2021-08-10 ENCOUNTER — Telehealth: Payer: Self-pay | Admitting: *Deleted

## 2021-08-10 NOTE — Telephone Encounter (Signed)
Received call from Marylene Land with [facility] [phone]) requesting ICD 10 code for ferratin, CBC and iron studies.  RN provided Marylene Land with ICD 10 code D50.9.  verbalized understanding.

## 2021-08-15 ENCOUNTER — Telehealth: Payer: Self-pay | Admitting: Hematology and Oncology

## 2021-08-15 NOTE — Telephone Encounter (Signed)
.  Called patient to schedule appointment per 7/5 inbasket, patient is aware of date and time.   

## 2021-08-16 ENCOUNTER — Inpatient Hospital Stay: Payer: BC Managed Care – PPO | Attending: Hematology and Oncology | Admitting: Hematology and Oncology

## 2021-08-16 DIAGNOSIS — D509 Iron deficiency anemia, unspecified: Secondary | ICD-10-CM | POA: Diagnosis not present

## 2021-08-16 NOTE — Progress Notes (Signed)
HEMATOLOGY-ONCOLOGY TELEPHONE VISIT PROGRESS NOTE  I connected with our patient on 08/16/21 at 11:30 AM EDT by telephone and verified that I am speaking with the correct person using two identifiers.  I discussed the limitations, risks, security and privacy concerns of performing an evaluation and management service by telephone and the availability of in person appointments.  I also discussed with the patient that there may be a patient responsible charge related to this service. The patient expressed understanding and agreed to proceed.   History of Present Illness:  Cassidy Shaffer is a 26 y.o. with above-mentioned history of IDA. She presents to the clinic today for a telephone follow-up.  She reports that her menstrual cycles have become less often and not as heavy and therefore she is no longer having any symptoms of iron deficiency.  Overall she feels fairly well.  REVIEW OF SYSTEMS:   Constitutional: Denies fevers, chills or abnormal weight loss All other systems were reviewed with the patient and are negative. Observations/Objective:     Assessment Plan:  Iron deficiency anemia 05/30/20 showed RBC 4.39, HGB 11.6, HCT 36.4, MCV 82.9, PLT 363, Vit D 21, B12 337 07/21/2020: Hemoglobin 13, platelets 351, MCV 86, creatinine 0.81, ferritin 3, iron saturation 6%, TIBC 419   Patient has profound iron deficiency with severe symptoms of fatigue and generalized weakness. This is been ongoing since January 2022 Most likely etiology: Heavy uterine bleeding (this is much improved): skipping cycles sometimes has also helped.   IV iron: July 2022  Lab review: 10/13/2020: Celiac antibodies: Negative, hemoglobin 12.8, MCV 86.6, RDW 16.1, platelets 270, iron saturation 28%, ferritin 56 02/15/2021: Hemoglobin 14.5, MCV 91, ferritin 26, iron saturation 19% 08/03/2021: Hb 13.2, MCV 88, platelets 270, iron saturation 21%, ferritin 17 ([facility])  Based on the above numbers she does not need IV iron. Her  insurance is changing where she can get [facility] and therefore in December she will do labs in our clinic I will call her 2 days later with the result of the test and discuss whether or not she needs any additional iron.  I discussed the assessment and treatment plan with the patient. The patient was provided an opportunity to ask questions and all were answered. The patient agreed with the plan and demonstrated an understanding of the instructions. The patient was advised to call back or seek an in-person evaluation if the symptoms worsen or if the condition fails to improve as anticipated.   I provided 12 minutes of non-face-to-face time during this encounter.  This includes time for charting and coordination of care   Tamsen Meek, MD   I Janan Ridge am scribing for Dr. Pamelia Hoit  I have reviewed the above documentation for accuracy and completeness, and I agree with the above.

## 2021-08-16 NOTE — Assessment & Plan Note (Signed)
05/30/20 showed RBC 4.39, HGB 11.6, HCT 36.4, MCV 82.9, PLT 363, Vit D 21, B12 337 07/21/2020: Hemoglobin 13, platelets 351, MCV 86, creatinine 0.81, ferritin 3, iron saturation 6%, TIBC 419  Patient has profound iron deficiency with severe symptoms of fatigue and generalized weakness. This is been ongoingsince January 2022 Most likely etiology: Heavy uterine bleeding(this is much improved)  IV iron: July 2022  Lab review: 10/13/2020: Celiac antibodies: Negative, hemoglobin 12.8, MCV 86.6, RDW 16.1, platelets 270, iron saturation 28%, ferritin 56 02/15/2021: Hemoglobin 14.5, MCV 91, ferritin 26, iron saturation 19%

## 2021-08-20 ENCOUNTER — Telehealth: Payer: Self-pay | Admitting: Hematology and Oncology

## 2021-08-20 NOTE — Telephone Encounter (Signed)
Scheduled appointment per 7/6 los. ;eft voicemail.

## 2021-11-19 ENCOUNTER — Telehealth: Payer: Self-pay | Admitting: *Deleted

## 2021-11-19 NOTE — Telephone Encounter (Signed)
Received call from pt requesting lab prescription be scanned to the email address on file as well as mailed to the address on file.  RN successfully scanned and placed hard copy in the mail to address on file for pt.

## 2021-12-08 NOTE — Progress Notes (Signed)
   Patient Care Team: Rip Harbour, NP as PCP - General (Nurse Practitioner)  DIAGNOSIS: No diagnosis found.  SUMMARY OF ONCOLOGIC HISTORY: Oncology History   No history exists.    CHIEF COMPLIANT: Follow-up IDA  INTERVAL HISTORY: Cassidy Shaffer is a 26 y.o. with above-mentioned history of IDA. She presents to the clinic today for a telephone follow-up.   ALLERGIES:  has No Known Allergies.  MEDICATIONS:  No current outpatient medications on file.   No current facility-administered medications for this visit.    PHYSICAL EXAMINATION: ECOG PERFORMANCE STATUS: {CHL ONC ECOG PS:[id number]}  There were no vitals filed for this visit. There were no vitals filed for this visit.  BREAST:*** No palpable masses or nodules in either right or left breasts. No palpable axillary supraclavicular or infraclavicular adenopathy no breast tenderness or nipple discharge. (exam performed in the presence of a chaperone)  LABORATORY DATA:  I have reviewed the data as listed    Latest Ref Rng & Units 02/15/2021    2:53 PM 05/30/2020   12:00 AM 12/01/2019    3:25 PM  CMP  Glucose 70 - 99 mg/dL 89  86  80   BUN 6 - 20 mg/dL 9  11  11    Creatinine 0.57 - 1.00 mg/dL 0.74  0.67  0.86   Sodium 134 - 144 mmol/L 140  139  138   Potassium 3.5 - 5.2 mmol/L 4.4  4.1  4.1   Chloride 96 - 106 mmol/L 105  106  104   CO2 20 - 29 mmol/L 23  27  28    Calcium 8.7 - 10.2 mg/dL 9.2  9.3  9.3   Total Protein 6.0 - 8.5 g/dL 6.7  6.8  6.6   Total Bilirubin 0.0 - 1.2 mg/dL 0.2  0.2  0.4   Alkaline Phos 44 - 121 IU/L 70     AST 0 - 40 IU/L 18  13  16    ALT 0 - 32 IU/L 18  14  15      Lab Results  Component Value Date   WBC 6.9 02/15/2021   HGB 14.5 02/15/2021   HCT 42.9 02/15/2021   MCV 91 02/15/2021   PLT 299 02/15/2021   NEUTROABS 4.6 02/15/2021    ASSESSMENT & PLAN:  No problem-specific Assessment & Plan notes found for this encounter.    No orders of the defined types were placed in this  encounter.  The patient has a good understanding of the overall plan. she agrees with it. she will call with any problems that may develop before the next visit here. Total time spent: 30 mins including face to face time and time spent for planning, charting and co-ordination of care   Suzzette Righter, [place] 12/08/21    I Gardiner Coins am scribing for Dr. Lindi Adie  ***

## 2021-12-13 ENCOUNTER — Inpatient Hospital Stay: Payer: BC Managed Care – PPO | Attending: Hematology and Oncology | Admitting: Hematology and Oncology

## 2021-12-13 ENCOUNTER — Encounter: Payer: Self-pay | Admitting: Hematology and Oncology

## 2021-12-13 VITALS — BP 127/88 | HR 80 | Temp 97.7°F | Resp 18 | Ht 67.0 in | Wt 174.0 lb

## 2021-12-13 DIAGNOSIS — D509 Iron deficiency anemia, unspecified: Secondary | ICD-10-CM | POA: Insufficient documentation

## 2021-12-13 NOTE — Assessment & Plan Note (Signed)
05/30/20 showed RBC 4.39, HGB 11.6, HCT 36.4, MCV 82.9, PLT 363, Vit D 21, B12 337 07/21/2020: Hemoglobin 13, platelets 351, MCV 86, creatinine 0.81, ferritin 3, iron saturation 6%, TIBC 419   Patient has profound iron deficiency with severe symptoms of fatigue and generalized weakness. This is been ongoing since January 2022 Most likely etiology: Heavy uterine bleeding (this is much improved): skipping cycles sometimes has also helped.   IV iron: July 2022   Lab review: 10/13/2020: Celiac antibodies: Negative, hemoglobin 12.8, MCV 86.6, RDW 16.1, platelets 270, iron saturation 28%, ferritin 56 02/15/2021: Hemoglobin 14.5, MCV 91, ferritin 26, iron saturation 19% 08/03/2021: Hb 13.2, MCV 88, platelets 270, iron saturation 21%, ferritin 17 ([facility])

## 2021-12-14 ENCOUNTER — Telehealth: Payer: Self-pay | Admitting: Hematology and Oncology

## 2021-12-14 NOTE — Telephone Encounter (Signed)
Scheduled appointment per 11/2 los. Left voicemail. 

## 2021-12-20 ENCOUNTER — Inpatient Hospital Stay: Payer: BC Managed Care – PPO

## 2021-12-20 VITALS — BP 113/82 | HR 66 | Temp 98.5°F | Resp 18

## 2021-12-20 DIAGNOSIS — D509 Iron deficiency anemia, unspecified: Secondary | ICD-10-CM

## 2021-12-20 MED ORDER — SODIUM CHLORIDE 0.9 % IV SOLN
300.0000 mg | Freq: Once | INTRAVENOUS | Status: AC
  Administered 2021-12-20: 300 mg via INTRAVENOUS
  Filled 2021-12-20: qty 300

## 2021-12-20 MED ORDER — SODIUM CHLORIDE 0.9 % IV SOLN: Freq: Once | INTRAVENOUS | Status: AC

## 2021-12-20 NOTE — Patient Instructions (Signed)

## 2021-12-27 ENCOUNTER — Inpatient Hospital Stay: Payer: BC Managed Care – PPO

## 2021-12-27 VITALS — BP 122/74 | HR 62 | Temp 98.5°F | Resp 18 | Ht 67.0 in | Wt 174.6 lb

## 2021-12-27 DIAGNOSIS — D509 Iron deficiency anemia, unspecified: Secondary | ICD-10-CM

## 2021-12-27 MED ORDER — SODIUM CHLORIDE 0.9 % IV SOLN: Freq: Once | INTRAVENOUS | Status: AC

## 2021-12-27 MED ORDER — SODIUM CHLORIDE 0.9 % IV SOLN
300.0000 mg | Freq: Once | INTRAVENOUS | Status: AC
  Administered 2021-12-27: 300 mg via INTRAVENOUS
  Filled 2021-12-27: qty 300

## 2021-12-27 NOTE — Patient Instructions (Signed)

## 2021-12-31 ENCOUNTER — Telehealth: Payer: Self-pay | Admitting: *Deleted

## 2021-12-31 NOTE — Telephone Encounter (Signed)
Received call from pt stating 3 days post IV Venofer infusion she experiencing headache, fever of 101.0, HR 110-120, and full body/muscle aches. Pt states symptoms lasted x1 day and then dissipated.  Per MD pt needing to receive Tylenol 650 mg p.o as well as Benadryl 25 mg p.o as pre medications prior to IV iron infusion.  MD also requests pt take Tylenol and Benadryl p.o at home x3 days following IV Iron infusion.  Pt educated and verbalized understanding.

## 2022-01-01 ENCOUNTER — Encounter: Payer: Self-pay | Admitting: Hematology and Oncology

## 2022-01-07 ENCOUNTER — Inpatient Hospital Stay: Payer: BC Managed Care – PPO

## 2022-01-07 VITALS — BP 115/83 | HR 67 | Temp 98.6°F | Resp 16

## 2022-01-07 DIAGNOSIS — D509 Iron deficiency anemia, unspecified: Secondary | ICD-10-CM

## 2022-01-07 MED ORDER — SODIUM CHLORIDE 0.9 % IV SOLN: Freq: Once | INTRAVENOUS | Status: AC

## 2022-01-07 MED ORDER — ACETAMINOPHEN 325 MG PO TABS
650.0000 mg | ORAL_TABLET | Freq: Once | ORAL | Status: AC
  Administered 2022-01-07: 650 mg via ORAL
  Filled 2022-01-07: qty 2

## 2022-01-07 MED ORDER — DIPHENHYDRAMINE HCL 25 MG PO CAPS
25.0000 mg | ORAL_CAPSULE | Freq: Once | ORAL | Status: AC
  Administered 2022-01-07: 25 mg via ORAL
  Filled 2022-01-07: qty 1

## 2022-01-07 MED ORDER — SODIUM CHLORIDE 0.9 % IV SOLN
300.0000 mg | Freq: Once | INTRAVENOUS | Status: AC
  Administered 2022-01-07: 300 mg via INTRAVENOUS
  Filled 2022-01-07: qty 300

## 2022-01-07 NOTE — Patient Instructions (Signed)

## 2022-02-18 ENCOUNTER — Other Ambulatory Visit: Payer: BC Managed Care – PPO

## 2022-02-20 ENCOUNTER — Telehealth: Payer: BC Managed Care – PPO | Admitting: Hematology and Oncology

## 2022-04-06 ENCOUNTER — Encounter: Payer: Self-pay | Admitting: Hematology and Oncology

## 2022-04-10 ENCOUNTER — Encounter: Payer: Self-pay | Admitting: Hematology and Oncology

## 2022-04-10 ENCOUNTER — Encounter: Payer: Self-pay | Admitting: Family Medicine

## 2022-04-10 ENCOUNTER — Ambulatory Visit (INDEPENDENT_AMBULATORY_CARE_PROVIDER_SITE_OTHER): Payer: Commercial Managed Care - PPO | Admitting: Family Medicine

## 2022-04-10 VITALS — BP 124/74 | HR 72 | Temp 97.2°F | Ht 67.0 in | Wt 175.0 lb

## 2022-04-10 DIAGNOSIS — Z0000 Encounter for general adult medical examination without abnormal findings: Secondary | ICD-10-CM | POA: Insufficient documentation

## 2022-04-10 DIAGNOSIS — Z6827 Body mass index (BMI) 27.0-27.9, adult: Secondary | ICD-10-CM

## 2022-04-10 NOTE — Assessment & Plan Note (Signed)
Education given.

## 2022-04-10 NOTE — Assessment & Plan Note (Signed)
Recommend continue to work on eating healthy diet and exercise.  

## 2022-04-10 NOTE — Progress Notes (Signed)
Subjective:  Patient ID: Cassidy Shaffer, female    DOB: 1995/08/25  Age: 27 y.o. MRN: [id number]  Chief Complaint  Patient presents with   Annual Exam    HPI Well Adult Physical: Patient here for a comprehensive physical exam.The patient reports no problems Do you take any herbs or supplements that were not prescribed by a doctor? no Are you taking calcium supplements? no Are you taking aspirin daily? no  Encounter for general adult medical examination without abnormal findings  Physical ("At Risk" items are starred): Patient's last physical exam was 1 year ago .  Patient is not afflicted from Stress Incontinence and Urge Incontinence  Patient wears a seat belts Patient has smoke detectors and has carbon monoxide detectors. Patient practices appropriate gun safety. Patient wears sunscreen with extended sun exposure. Dental Care: biannual cleanings, brushes and flosses daily. Ophthalmology/Optometry: Annual visit.  Hearing loss: none Vision impairments: Contacts  Menarche: 27 y.o Menstrual History: irregular cycles LMP: 04/05/22 Pregnancy history: None Safe at home: yes Self breast exams: yes     04/10/2022   11:16 AM 02/15/2021    1:59 PM 12/01/2019    9:25 AM  Depression screen PHQ 2/9  Decreased Interest 0 0 0  Down, Depressed, Hopeless 0 0 0  PHQ - 2 Score 0 0 0         09/12/2020    7:45 AM 02/15/2021    1:58 PM 12/27/2021    2:00 PM 01/07/2022    2:15 PM 04/10/2022   11:16 AM  Fall Risk  Falls in the past year?  0   0  Was there an injury with Fall?  0   0  Fall Risk Category Calculator  0   0  Fall Risk Category (Retired)  Low     (RETIRED) Patient Fall Risk Level Low fall risk Low fall risk Low fall risk Low fall risk   Patient at Risk for Falls Due to  No Fall Risks   No Fall Risks  Fall risk Follow up  Falls evaluation completed   Falls evaluation completed     Social Hx   Social History   Socioeconomic History   Marital status: Single    Spouse  name: Not on file   Number of children: Not on file   Years of education: Not on file   Highest education level: Not on file  Occupational History   Not on file  Tobacco Use   Smoking status: Never   Smokeless tobacco: Never  Vaping Use   Vaping Use: Never used  Substance and Sexual Activity   Alcohol use: Never   Drug use: Never   Sexual activity: Never  Other Topics Concern   Not on file  Social History Narrative   Not on file   Social Determinants of Health   Financial Resource Strain: Low Risk  (04/10/2022)   Overall Financial Resource Strain (CARDIA)    Difficulty of Paying Living Expenses: Not hard at all  Food Insecurity: No Food Insecurity (04/10/2022)   Hunger Vital Sign    Worried About Running Out of Food in the Last Year: Never true    [place] in the Last Year: Never true  Transportation Needs: No Transportation Needs (04/10/2022)   PRAPARE - Hydrologist (Medical): No    Lack of Transportation (Non-Medical): No  Physical Activity: Inactive (04/10/2022)   Exercise Vital Sign    Days of Exercise per Week: 0  days    Minutes of Exercise per Session: 0 min  Stress: No Stress Concern Present (04/10/2022)   [place]    Feeling of Stress : Not at all  Social Connections: Moderately Isolated (04/10/2022)   Social Connection and Isolation Panel [NHANES]    Frequency of Communication with Friends and Family: More than three times a week    Frequency of Social Gatherings with Friends and Family: More than three times a week    Attends Religious Services: More than 4 times per year    Active Member of [employer] or Organizations: No    Attends Music therapist: Never    Marital Status: Never married   Past Medical History:  Diagnosis Date   Allergy    Epigastric pain    GERD (gastroesophageal reflux disease)    Hyperlipidemia    borderline    Hypertension     past hx in nursing school    Past Surgical History:  Procedure Laterality Date   WISDOM TOOTH EXTRACTION      Family History  Problem Relation Age of Onset   Hyperlipidemia Mother    Osteoporosis Mother    Fibromyalgia Mother    Hypertension Father    Esophagitis Father    Hyperlipidemia Father    Colon polyps Father    Hypertension Maternal Grandmother    Diabetes Mellitus II Maternal Grandfather    COPD Maternal Grandfather    Hyperlipidemia Maternal Grandfather    Hypertension Maternal Grandfather    Hypertension Paternal Grandmother    Hyperlipidemia Paternal Grandmother    Hypertension Paternal Grandfather    Stroke Paternal Grandfather    Hyperlipidemia Paternal Grandfather    Heart attack Paternal Grandfather    Colon polyps Paternal Grandfather    Pancreatic cancer Maternal Great-grandfather    Colon cancer Neg Hx    Esophageal cancer Neg Hx    Rectal cancer Neg Hx    Stomach cancer Neg Hx     Review of Systems  Constitutional:  Negative for chills, fatigue and fever.  HENT:  Negative for congestion, ear pain, rhinorrhea and sore throat.   Respiratory:  Negative for cough and shortness of breath.   Cardiovascular:  Negative for chest pain.  Gastrointestinal:  Negative for abdominal pain, constipation, diarrhea, nausea and vomiting.  Genitourinary:  Negative for dysuria and urgency.  Musculoskeletal:  Negative for back pain and myalgias.  Neurological:  Negative for dizziness, weakness, light-headedness and headaches.  Psychiatric/Behavioral:  Negative for dysphoric mood. The patient is not nervous/anxious.      Objective:  BP 124/74   Pulse 72   Temp (!) 97.2 F (36.2 C)   Ht '5\' 7"'$  (1.702 m)   Wt 175 lb (79.4 kg)   LMP 04/05/2022   SpO2 100%   BMI 27.41 kg/m      04/10/2022   11:15 AM 01/07/2022    5:17 PM 01/07/2022    2:15 PM  BP/Weight  Systolic BP [id number]  Diastolic BP 74 83 86  Wt. (Lbs) 175    BMI 27.41 kg/m2      Physical  Exam Vitals reviewed.  Constitutional:      Appearance: Normal appearance.  HENT:     Right Ear: Tympanic membrane normal.     Left Ear: Tympanic membrane normal.     Nose: Nose normal.     Right Turbinates: Swollen.     Left Turbinates: Swollen.     Mouth/Throat:  Pharynx: No oropharyngeal exudate or posterior oropharyngeal erythema.  Eyes:     Conjunctiva/sclera: Conjunctivae normal.  Neck:     Vascular: No carotid bruit.  Cardiovascular:     Rate and Rhythm: Normal rate and regular rhythm.     Pulses: Normal pulses.     Heart sounds: Normal heart sounds.  Pulmonary:     Effort: Pulmonary effort is normal.     Breath sounds: Normal breath sounds.  Abdominal:     General: Bowel sounds are normal.     Palpations: There is no mass.     Tenderness: There is no abdominal tenderness.  Musculoskeletal:     Cervical back: Normal range of motion.  Skin:    Findings: Lesion present.     Comments: Mole on left chest - no irregularities.   Neurological:     Mental Status: She is alert and oriented to person, place, and time.  Psychiatric:        Mood and Affect: Mood normal.        Behavior: Behavior normal.     Lab Results  Component Value Date   WBC 6.9 02/15/2021   HGB 14.5 02/15/2021   HCT 42.9 02/15/2021   PLT 299 02/15/2021   GLUCOSE 89 02/15/2021   ALT 18 02/15/2021   AST 18 02/15/2021   NA 140 02/15/2021   K 4.4 02/15/2021   CL 105 02/15/2021   CREATININE 0.74 02/15/2021   BUN 9 02/15/2021   CO2 23 02/15/2021   TSH 2.55 05/30/2020      Assessment & Plan:  Routine medical exam Education given.  BMI 27.0-27.9,adult Recommend continue to work on eating healthy diet and exercise.    Body mass index is 27.41 kg/m.   These are the goals we discussed:  Goals      Exercise 3x per week (30 min per time)         This is a list of the screening recommended for you and due dates:  Health Maintenance  Topic Date Due   HPV Vaccine (1 - 2-dose  series) Never done   Pap Smear  Never done   Pap Smear  Never done   DTaP/Tdap/Td vaccine (2 - Td or Tdap) 08/12/2027   Flu Shot  Completed   COVID-19 Vaccine  Discontinued   Hepatitis C Screening: USPSTF Recommendation to screen - Ages 45-79 yo.  Discontinued   HIV Screening  Discontinued     No orders of the defined types were placed in this encounter.   Follow-up: Return in about 1 year (around 04/11/2023) for cpe.  An After Visit Summary was printed and given to the patient.  Rochel Brome, MD [facility] [phone]

## 2022-05-22 ENCOUNTER — Telehealth: Payer: Self-pay | Admitting: Hematology and Oncology

## 2022-05-22 NOTE — Telephone Encounter (Signed)
Rescheduled appointment per provider PAL. Patient is aware of the changes made to her upcoming appointment. 

## 2022-06-03 ENCOUNTER — Other Ambulatory Visit: Payer: Self-pay

## 2022-06-03 ENCOUNTER — Inpatient Hospital Stay: Payer: Commercial Managed Care - PPO | Attending: Hematology and Oncology

## 2022-06-03 DIAGNOSIS — D509 Iron deficiency anemia, unspecified: Secondary | ICD-10-CM | POA: Insufficient documentation

## 2022-06-03 LAB — CBC WITH DIFFERENTIAL (CANCER CENTER ONLY)
Abs Immature Granulocytes: 0.02 10*3/uL (ref 0.00–0.07)
Basophils Absolute: 0.1 10*3/uL (ref 0.0–0.1)
Basophils Relative: 1 %
Eosinophils Absolute: 0.2 10*3/uL (ref 0.0–0.5)
Eosinophils Relative: 2 %
HCT: 41.8 % (ref 36.0–46.0)
Hemoglobin: 14.8 g/dL (ref 12.0–15.0)
Immature Granulocytes: 0 %
Lymphocytes Relative: 14 %
Lymphs Abs: 1.4 10*3/uL (ref 0.7–4.0)
MCH: 31.4 pg (ref 26.0–34.0)
MCHC: 35.4 g/dL (ref 30.0–36.0)
MCV: 88.6 fL (ref 80.0–100.0)
Monocytes Absolute: 0.7 10*3/uL (ref 0.1–1.0)
Monocytes Relative: 7 %
Neutro Abs: 7.5 10*3/uL (ref 1.7–7.7)
Neutrophils Relative %: 76 %
Platelet Count: 295 10*3/uL (ref 150–400)
RBC: 4.72 MIL/uL (ref 3.87–5.11)
RDW: 11.5 % (ref 11.5–15.5)
WBC Count: 9.8 10*3/uL (ref 4.0–10.5)
nRBC: 0 % (ref 0.0–0.2)

## 2022-06-03 LAB — IRON AND IRON BINDING CAPACITY (CC-WL,HP ONLY)
Iron: 99 ug/dL (ref 28–170)
Saturation Ratios: 31 % (ref 10.4–31.8)
TIBC: 316 ug/dL (ref 250–450)
UIBC: 217 ug/dL (ref 148–442)

## 2022-06-03 LAB — FERRITIN: Ferritin: 56 ng/mL (ref 11–307)

## 2022-06-04 DIAGNOSIS — Z3202 Encounter for pregnancy test, result negative: Secondary | ICD-10-CM | POA: Diagnosis not present

## 2022-06-04 DIAGNOSIS — N912 Amenorrhea, unspecified: Secondary | ICD-10-CM | POA: Diagnosis not present

## 2022-06-04 DIAGNOSIS — R102 Pelvic and perineal pain: Secondary | ICD-10-CM | POA: Diagnosis not present

## 2022-06-04 DIAGNOSIS — N760 Acute vaginitis: Secondary | ICD-10-CM | POA: Diagnosis not present

## 2022-06-06 ENCOUNTER — Encounter: Payer: Self-pay | Admitting: Adult Health

## 2022-06-06 ENCOUNTER — Inpatient Hospital Stay (HOSPITAL_BASED_OUTPATIENT_CLINIC_OR_DEPARTMENT_OTHER): Payer: Commercial Managed Care - PPO | Admitting: Adult Health

## 2022-06-06 ENCOUNTER — Inpatient Hospital Stay: Payer: Commercial Managed Care - PPO | Admitting: Hematology and Oncology

## 2022-06-06 DIAGNOSIS — D509 Iron deficiency anemia, unspecified: Secondary | ICD-10-CM | POA: Diagnosis not present

## 2022-06-06 NOTE — Progress Notes (Signed)
[facility] Cancer Follow up:    Blane Ohara, MD [address] [place] [address]  I connected with Cassidy Shaffer on 06/06/22 at  8:30 AM EDT by telephone and verified that I am speaking with the correct person using two identifiers.  I discussed the limitations, risks, security and privacy concerns of performing an evaluation and management service by telephone and the availability of in person appointments.  I also discussed with the patient that there may be a patient responsible charge related to this service. The patient expressed understanding and agreed to proceed.   Provider location: [facility] provider office Patient location: At work at [employer] in break room, in private  DIAGNOSIS: iron deficiency anemia  SUMMARY OF HEMATOLOGIC HISTORY: CT abdomen/pelvis 01/11/2020: negative for etiology of generalized abdominal pain and reflux, no incidental findings, normal scan. Upper endoscopy 03/20/20 for abdominal pain and heartburn. Minimal gastritis noted, Omeprazole daily, biopsy showed gastritis, H. Pylori was negative Iron deficiency identified in June, 2022 with labs: Hemoglobin 13, platelets 351, MCV 86, creatinine 0.81, ferritin 3, iron saturation 6%, TIBC 419 New patient appointment with hematology in 08/2020--diagnosed with iron deficiency.  Followed by gynecology and gastroenterology.   IV Venofer x 3 08/25/2020; 12/20/2021 (requires premedication with Benadryl and Tylenol)   CURRENT THERAPY: Intermittent IV iron  INTERVAL HISTORY: Cassidy Shaffer 27 y.o. female returns for f/u of her iron deficiency.  She last received IV iron with Venofer  weekly x 3 beginning on 12/20/2021, she developed flu symptoms and a fever after her second dose and received tylenol and benadryl prior to her third dose and tolerated it without difficulty.    She tells me that her last menstrual cycle was 04/05/2022, and she is seeing gynecology to identify an etiology for her  irregular cycles.  They are testing labs and she had a negative pregnancy test.     Patient Active Problem List   Diagnosis Date Noted   BMI 27.0-27.9,adult 04/10/2022   Iron deficiency anemia 08/21/2020   Gastroesophageal reflux disease without esophagitis 12/01/2019    has No Known Allergies.  MEDICAL HISTORY: Past Medical History:  Diagnosis Date   Allergy    Epigastric pain    GERD (gastroesophageal reflux disease)    Hyperlipidemia    borderline    Hypertension    past hx in nursing school     SURGICAL HISTORY: Past Surgical History:  Procedure Laterality Date   WISDOM TOOTH EXTRACTION      SOCIAL HISTORY: Reviewed and unchanged   FAMILY HISTORY: Reviewed and unchanged  Review of Systems  Constitutional:  Negative for appetite change, chills, fatigue, fever and unexpected weight change.  HENT:   Negative for hearing loss, lump/mass and trouble swallowing.   Eyes:  Negative for eye problems and icterus.  Respiratory:  Negative for chest tightness, cough and shortness of breath.   Cardiovascular:  Negative for chest pain, leg swelling and palpitations.  Gastrointestinal:  Negative for abdominal distention, abdominal pain, constipation, diarrhea, nausea and vomiting.  Endocrine: Negative for hot flashes.  Genitourinary:  Negative for difficulty urinating.   Musculoskeletal:  Negative for arthralgias.  Skin:  Negative for itching and rash.  Neurological:  Negative for dizziness, extremity weakness, headaches and numbness.  Hematological:  Negative for adenopathy. Does not bruise/bleed easily.  Psychiatric/Behavioral:  Negative for depression. The patient is not nervous/anxious.       PHYSICAL EXAMINATION  Patient sounds well, in no apparent distress, mood and behavior  normal, speech normal, breathing is not labored.   LABORATORY DATA: Reviewed in [facility] demonstrating ferritin of 51, normal hemoglobin, improved iron levels    ASSESSMENT and THERAPY PLAN:    Iron deficiency anemia Cassidy Shaffer is a 27 year old woman with history of iron deficiency here today for follow-up and evaluation after receiving iron infusion.  She will continue on intermittent IV iron for her iron deficiency.  We will recheck her iron studies again in 12 weeks her most recent lab studies are much improved, as are her symptoms.  She will continue to follow-up with gynecology about her irregular menstrual cycles.   Follow up instructions:    -Return to cancer center 12 weeks for lab only, virtual visit with LC or VG a few days later  -continue Follow up with GYN around menstrual cycle abnormalities   The patient was provided an opportunity to ask questions and all were answered. The patient agreed with the plan and demonstrated an understanding of the instructions.   The patient was advised to call back or seek an in-person evaluation if the symptoms worsen or if the condition fails to improve as anticipated.   I provided 15 minutes of non face-to-face telephone visit time during this encounter, and > 50% was spent counseling as documented under my assessment & plan.  Lillard Anes, NP 06/06/22 9:40 AM Medical Oncology and Hematology [facility] [address] [place], [place] [address] Tel. [phone]    Fax. [phone]

## 2022-06-06 NOTE — Assessment & Plan Note (Signed)
Cassidy Shaffer is a 27 year old woman with history of iron deficiency here today for follow-up and evaluation after receiving iron infusion.  She will continue on intermittent IV iron for her iron deficiency.  We will recheck her iron studies again in 12 weeks her most recent lab studies are much improved, as are her symptoms.  She will continue to follow-up with gynecology about her irregular menstrual cycles.

## 2022-06-10 ENCOUNTER — Telehealth: Payer: Self-pay | Admitting: Adult Health

## 2022-06-10 NOTE — Telephone Encounter (Signed)
Scheduled appointments per los. Patient is aware of the made appointments. 

## 2022-06-14 ENCOUNTER — Inpatient Hospital Stay: Payer: Commercial Managed Care - PPO

## 2022-06-17 ENCOUNTER — Inpatient Hospital Stay: Payer: Commercial Managed Care - PPO | Admitting: Hematology and Oncology

## 2022-07-15 IMAGING — CT CT ANGIO CHEST
2 of 8 series · 19 of 36 positions shown · IV contrast (Omnipaque)
Comparison: Chest radiograph performed the same day and CT chest
dated 06/21/2019.

CLINICAL DATA: Chest pain for 2 weeks.

EXAM:
CT ANGIOGRAPHY CHEST WITH CONTRAST
TECHNIQUE: Multidetector CT imaging of the chest was performed using the
standard protocol during bolus administration of intravenous
contrast. Multiplanar CT image reconstructions and MIPs were
obtained to evaluate the vascular anatomy.
CONTRAST:  75mL OMNIPAQUE IOHEXOL 350 MG/ML SOLN

[Series 5: pe thins · axial · 0.60mm/px · z∈[-255,-17]mm · 18 of 268 slices shown]
[im 15/268  lung]
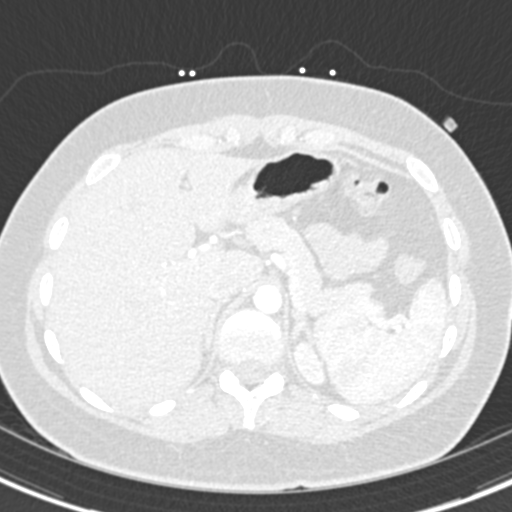
[im 29/268  mediastinal]
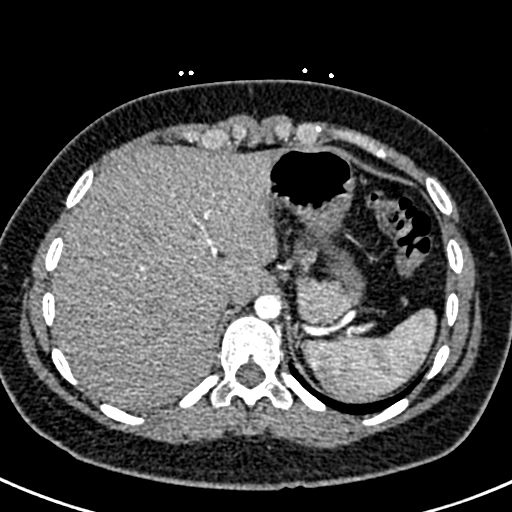
[im 43/268  lung]
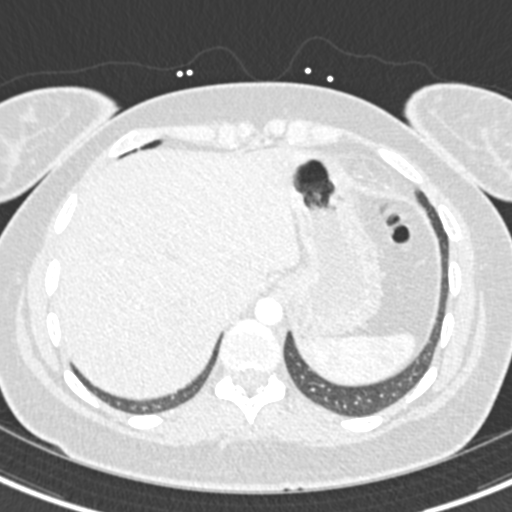
[im 57/268  mediastinal]
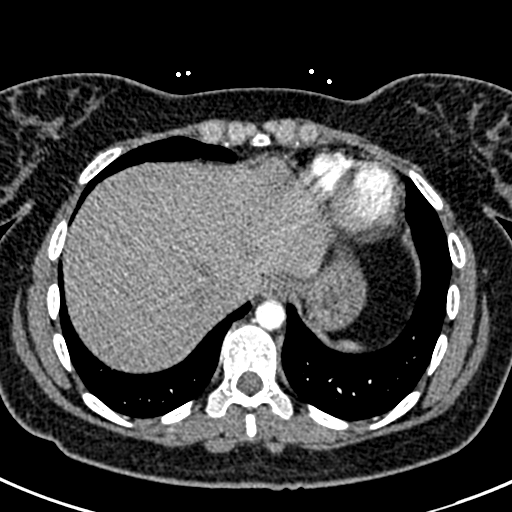
[im 71/268  lung]
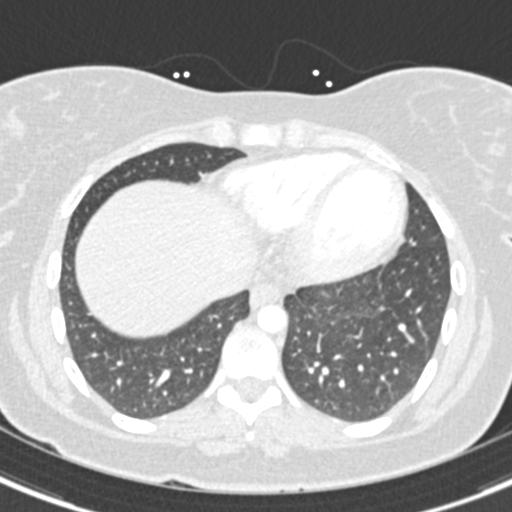
[im 85/268  mediastinal]
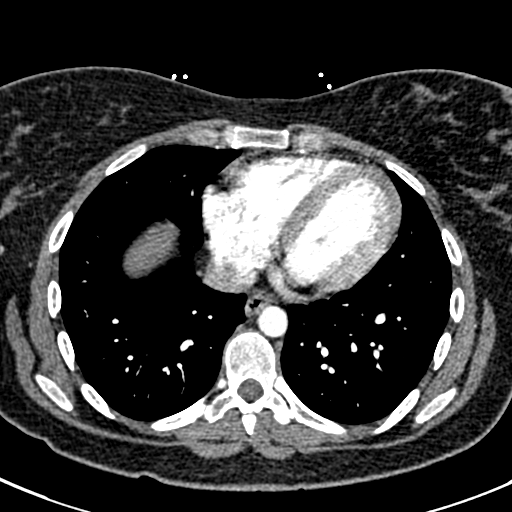
[im 99/268  lung]
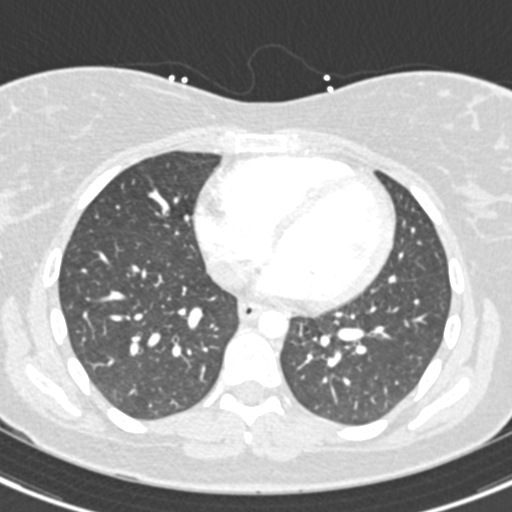
[im 113/268  mediastinal]
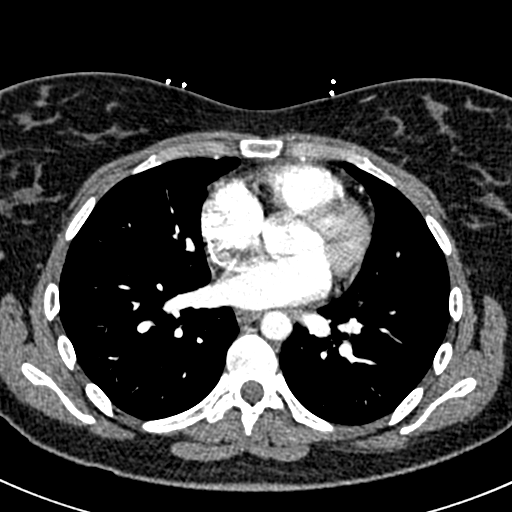
[im 127/268  lung]
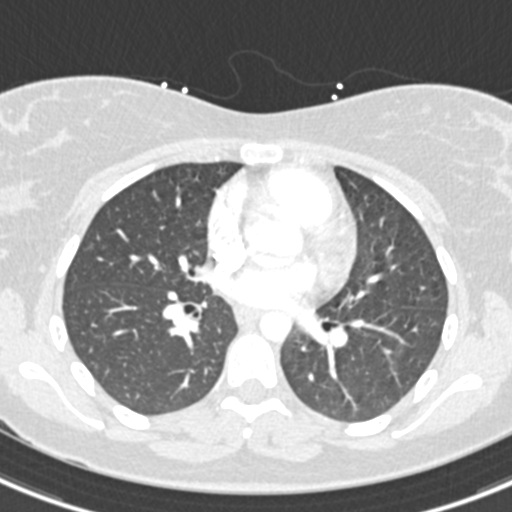
[im 141/268  mediastinal]
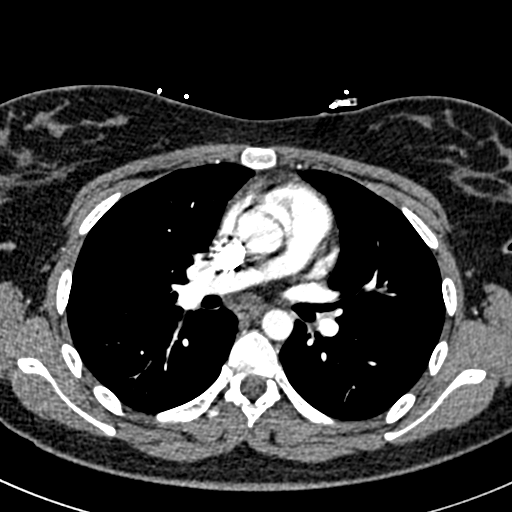
[im 155/268  lung]
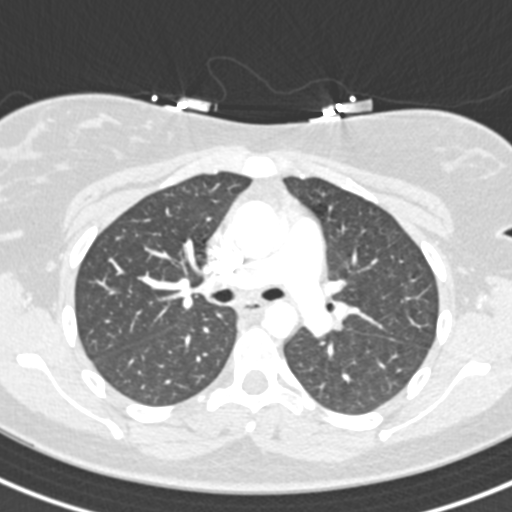
[im 169/268  mediastinal]
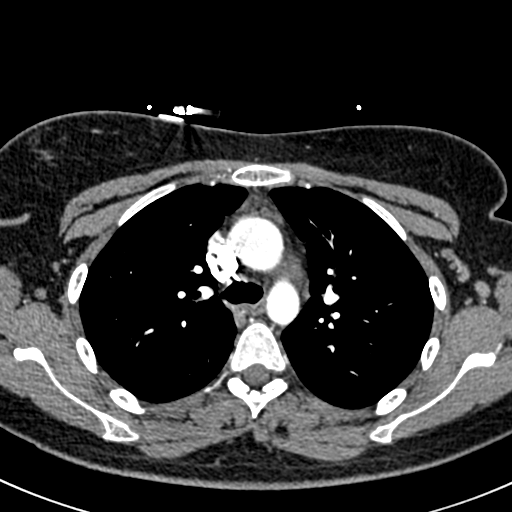
[im 183/268  lung]
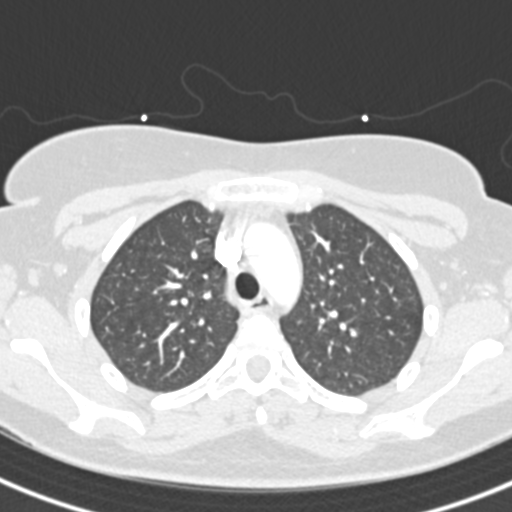
[im 197/268  mediastinal]
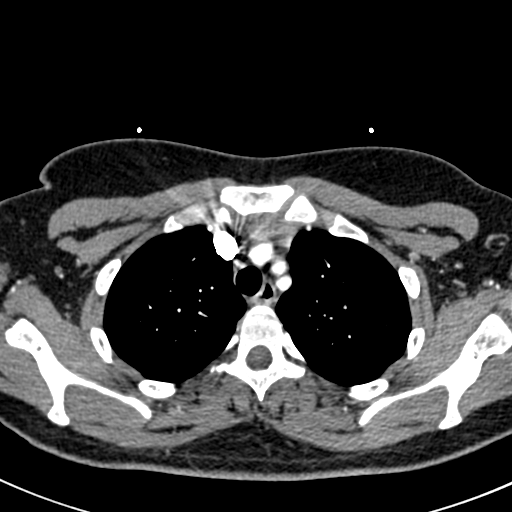
[im 211/268  lung]
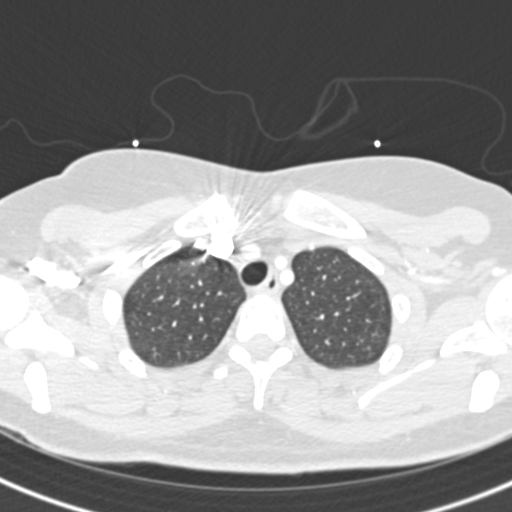
[im 225/268  mediastinal]
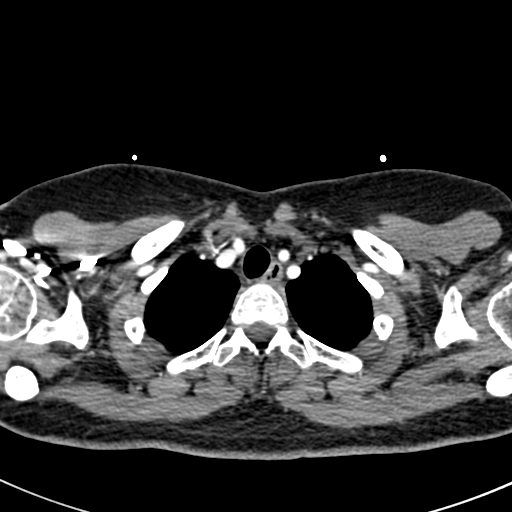
[im 239/268  lung]
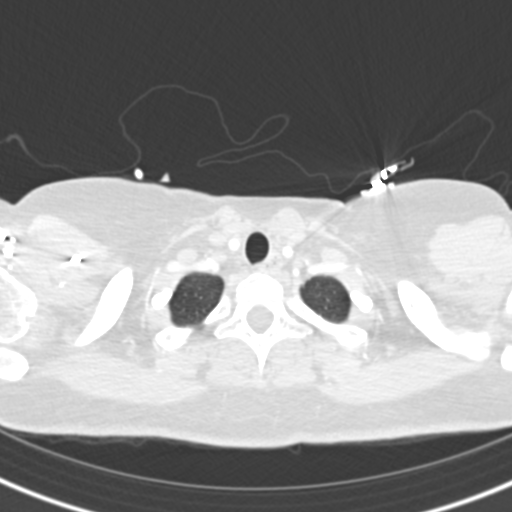
[im 253/268  mediastinal]
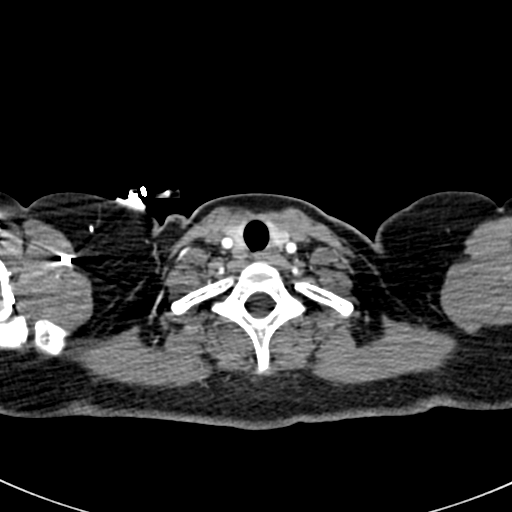

[Series 7: pe coronal mpr · coronal · 0.55mm/px · 1 of 104 slices shown]
[im 52/104  mediastinal]
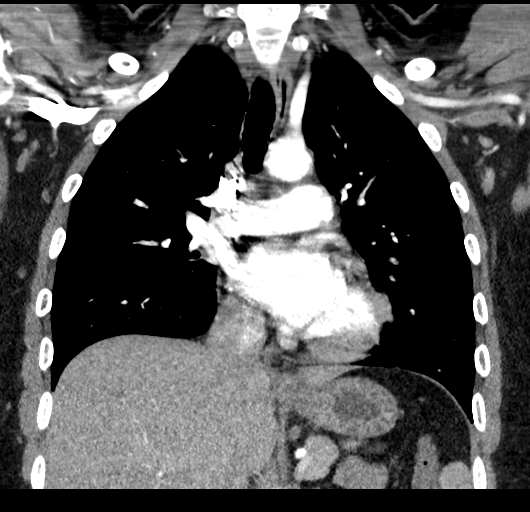

[19 of 36 positions shown; findings below may reference images not displayed]

FINDINGS: Cardiovascular: Satisfactory opacification of the pulmonary arteries
to the segmental level. No evidence of pulmonary embolism. Normal
heart size. No pericardial effusion.

Mediastinum/Nodes: No enlarged mediastinal, hilar, or axillary lymph
nodes. Thyroid gland, trachea, and esophagus demonstrate no
significant findings.

Lungs/Pleura: Lungs are clear. No pleural effusion or pneumothorax.

Upper Abdomen: No acute abnormality.

Musculoskeletal: No chest wall abnormality. No acute or significant
osseous findings.

Review of the MIP images confirms the above findings.
IMPRESSION: Negative for pulmonary embolism or acute pulmonary process.

## 2022-07-16 DIAGNOSIS — H5213 Myopia, bilateral: Secondary | ICD-10-CM | POA: Diagnosis not present

## 2022-07-16 DIAGNOSIS — D3132 Benign neoplasm of left choroid: Secondary | ICD-10-CM | POA: Diagnosis not present

## 2022-07-30 ENCOUNTER — Telehealth: Payer: Self-pay | Admitting: Hematology and Oncology

## 2022-08-27 ENCOUNTER — Other Ambulatory Visit: Payer: Self-pay

## 2022-08-27 ENCOUNTER — Inpatient Hospital Stay: Payer: Commercial Managed Care - PPO | Attending: Hematology and Oncology

## 2022-08-27 DIAGNOSIS — D509 Iron deficiency anemia, unspecified: Secondary | ICD-10-CM | POA: Insufficient documentation

## 2022-08-27 LAB — CBC WITH DIFFERENTIAL (CANCER CENTER ONLY)
Abs Immature Granulocytes: 0.01 10*3/uL (ref 0.00–0.07)
Basophils Absolute: 0.1 10*3/uL (ref 0.0–0.1)
Basophils Relative: 1 %
Eosinophils Absolute: 0.1 10*3/uL (ref 0.0–0.5)
Eosinophils Relative: 2 %
HCT: 42 % (ref 36.0–46.0)
Hemoglobin: 14.2 g/dL (ref 12.0–15.0)
Immature Granulocytes: 0 %
Lymphocytes Relative: 18 %
Lymphs Abs: 1.1 10*3/uL (ref 0.7–4.0)
MCH: 30.4 pg (ref 26.0–34.0)
MCHC: 33.8 g/dL (ref 30.0–36.0)
MCV: 89.9 fL (ref 80.0–100.0)
Monocytes Absolute: 0.5 10*3/uL (ref 0.1–1.0)
Monocytes Relative: 9 %
Neutro Abs: 4.2 10*3/uL (ref 1.7–7.7)
Neutrophils Relative %: 70 %
Platelet Count: 282 10*3/uL (ref 150–400)
RBC: 4.67 MIL/uL (ref 3.87–5.11)
RDW: 11.8 % (ref 11.5–15.5)
WBC Count: 5.9 10*3/uL (ref 4.0–10.5)
nRBC: 0 % (ref 0.0–0.2)

## 2022-08-27 LAB — VITAMIN B12: Vitamin B-12: 198 pg/mL (ref 180–914)

## 2022-08-27 LAB — RETIC PANEL
Immature Retic Fract: 5.3 % (ref 2.3–15.9)
RBC.: 4.56 MIL/uL (ref 3.87–5.11)
Retic Count, Absolute: 67.9 10*3/uL (ref 19.0–186.0)
Retic Ct Pct: 1.5 % (ref 0.4–3.1)
Reticulocyte Hemoglobin: 34.5 pg (ref >27.9)

## 2022-08-27 LAB — IRON AND IRON BINDING CAPACITY (CC-WL,HP ONLY)
Iron: 163 ug/dL (ref 28–170)
Saturation Ratios: 56 % — ABNORMAL HIGH (ref 10.4–31.8)
TIBC: 291 ug/dL (ref 250–450)
UIBC: 128 ug/dL — ABNORMAL LOW (ref 148–442)

## 2022-08-27 LAB — CMP (CANCER CENTER ONLY)
ALT: 18 U/L (ref 0–44)
AST: 15 U/L (ref 15–41)
Albumin: 4.3 g/dL (ref 3.5–5.0)
Alkaline Phosphatase: 68 U/L (ref 38–126)
Anion gap: 4 — ABNORMAL LOW (ref 5–15)
BUN: 10 mg/dL (ref 6–20)
CO2: 26 mmol/L (ref 22–32)
Calcium: 9.4 mg/dL (ref 8.9–10.3)
Chloride: 107 mmol/L (ref 98–111)
Creatinine: 0.79 mg/dL (ref 0.44–1.00)
GFR, Estimated: >60 mL/min (ref >60)
Glucose, Bld: 78 mg/dL (ref 70–99)
Potassium: 4 mmol/L (ref 3.5–5.1)
Sodium: 137 mmol/L (ref 135–145)
Total Bilirubin: 0.6 mg/dL (ref 0.3–1.2)
Total Protein: 7 g/dL (ref 6.5–8.1)

## 2022-08-27 LAB — FERRITIN: Ferritin: 60 ng/mL (ref 11–307)

## 2022-08-27 NOTE — Progress Notes (Signed)
HEMATOLOGY-ONCOLOGY TELEPHONE VISIT PROGRESS NOTE  I connected with our patient on 08/30/22 at 11:30 AM EDT by telephone and verified that I am speaking with the correct person using two identifiers.  I discussed the limitations, risks, security and privacy concerns of performing an evaluation and management service by telephone and the availability of in person appointments.  I also discussed with the patient that there may be a patient responsible charge related to this service. The patient expressed understanding and agreed to proceed.   History of Present Illness:  Cassidy Shaffer is a 27 y.o. with above-mentioned history of IDA. She presents to the clinic today for a telephone follow-up. Reports no issues with fatigue. Menstrual cycles are also regular now.  REVIEW OF SYSTEMS:   Constitutional: Denies fevers, chills or abnormal weight loss All other systems were reviewed with the patient and are negative. Observations/Objective:   Assessment Plan:  Iron deficiency anemia 05/30/20 showed RBC 4.39, HGB 11.6, HCT 36.4, MCV 82.9, PLT 363, Vit D 21, B12 337 07/21/2020: Hemoglobin 13, platelets 351, MCV 86, creatinine 0.81, ferritin 3, iron saturation 6%, TIBC 419   Patient has profound iron deficiency with severe symptoms of fatigue and generalized weakness. This is been ongoing since January 2022 Most likely etiology: Heavy uterine bleeding (this is much improved): skipping cycles sometimes has also helped.   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 The patient was provided an opportunity to ask questions and all were answered. The patient agreed with the plan and demonstrated an understanding of the instructions. The patient was advised to call back or seek an in-person evaluation if the symptoms worsen or if the condition fails to improve as anticipated.   I provided 12 minutes of non-face-to-face time during this encounter.  This includes time for charting and coordination of care   Tamsen Meek, MD  I Janan Ridge am acting as a scribe for Dr.Tryson Lumley  I have reviewed the above documentation for accuracy and completeness, and I agree with the above.

## 2022-08-29 ENCOUNTER — Inpatient Hospital Stay: Payer: Commercial Managed Care - PPO | Admitting: Hematology and Oncology

## 2022-08-30 ENCOUNTER — Inpatient Hospital Stay (HOSPITAL_BASED_OUTPATIENT_CLINIC_OR_DEPARTMENT_OTHER): Payer: Commercial Managed Care - PPO | Admitting: Hematology and Oncology

## 2022-08-30 DIAGNOSIS — D509 Iron deficiency anemia, unspecified: Secondary | ICD-10-CM

## 2022-08-30 NOTE — Assessment & Plan Note (Signed)
05/30/20 showed RBC 4.39, HGB 11.6, HCT 36.4, MCV 82.9, PLT 363, Vit D 21, B12 337 07/21/2020: Hemoglobin 13, platelets 351, MCV 86, creatinine 0.81, ferritin 3, iron saturation 6%, TIBC 419   Patient has profound iron deficiency with severe symptoms of fatigue and generalized weakness. This is been ongoing since January 2022 Most likely etiology: Heavy uterine bleeding (this is much improved): skipping cycles sometimes has also helped.   IV iron: July 2022, Nov 2023   Lab review: 10/13/2020: Celiac antibodies: Negative, hemoglobin 12.8, MCV 86.6, RDW 16.1, platelets 270, iron saturation 28%, ferritin 56 02/15/2021: Hemoglobin 14.5, MCV 91, ferritin 26, iron saturation 19% 08/03/2021: Hb 13.2, MCV 88, platelets 270, iron saturation 21%, ferritin 17 ([facility]) 12/07/2021: Hemoglobin 12.9, MCV 89.5, ferritin 14, iron saturation 24%, TIBC 322 08/27/2022: Hb 14.2, B 12: 198, Ferritin: 60, Iron Sat 56%, retics: 1.5%   Recheck labs in 6 months and telephone visit 2 days later to discuss results.

## 2022-09-02 ENCOUNTER — Telehealth: Payer: Self-pay | Admitting: Hematology and Oncology

## 2022-09-02 NOTE — Telephone Encounter (Signed)
Scheduled appointments per 7/19 los. Patient is aware of the made appointments.

## 2022-10-31 ENCOUNTER — Encounter: Payer: Self-pay | Admitting: Hematology and Oncology

## 2022-10-31 ENCOUNTER — Other Ambulatory Visit (HOSPITAL_COMMUNITY): Payer: Self-pay

## 2022-10-31 DIAGNOSIS — M5440 Lumbago with sciatica, unspecified side: Secondary | ICD-10-CM | POA: Diagnosis not present

## 2022-10-31 MED ORDER — METHYLPREDNISOLONE 4 MG PO TBPK
ORAL_TABLET | ORAL | 0 refills | Status: DC
  Filled 2022-10-31: qty 21, 6d supply, fill #0

## 2022-10-31 MED ORDER — TIZANIDINE HCL 4 MG PO TABS
4.0000 mg | ORAL_TABLET | Freq: Two times a day (BID) | ORAL | 0 refills | Status: DC
  Filled 2022-10-31: qty 40, 20d supply, fill #0

## 2022-11-07 DIAGNOSIS — M5440 Lumbago with sciatica, unspecified side: Secondary | ICD-10-CM | POA: Diagnosis not present

## 2022-11-20 DIAGNOSIS — M5440 Lumbago with sciatica, unspecified side: Secondary | ICD-10-CM | POA: Diagnosis not present

## 2022-11-21 ENCOUNTER — Other Ambulatory Visit (HOSPITAL_COMMUNITY): Payer: Self-pay

## 2022-11-21 DIAGNOSIS — M5440 Lumbago with sciatica, unspecified side: Secondary | ICD-10-CM | POA: Diagnosis not present

## 2022-11-21 MED ORDER — METHYLPREDNISOLONE 4 MG PO TBPK
ORAL_TABLET | ORAL | 0 refills | Status: DC
  Filled 2022-11-21: qty 21, 6d supply, fill #0

## 2022-11-26 ENCOUNTER — Other Ambulatory Visit (HOSPITAL_COMMUNITY): Payer: Self-pay | Admitting: Neurosurgery

## 2022-11-26 DIAGNOSIS — M5440 Lumbago with sciatica, unspecified side: Secondary | ICD-10-CM

## 2022-12-02 ENCOUNTER — Ambulatory Visit (HOSPITAL_COMMUNITY)
Admission: RE | Admit: 2022-12-02 | Discharge: 2022-12-02 | Disposition: A | Discharge status: Home / Self Care | Payer: Commercial Managed Care - PPO | Source: Ambulatory Visit | Attending: Neurosurgery | Admitting: Neurosurgery

## 2022-12-02 DIAGNOSIS — M5440 Lumbago with sciatica, unspecified side: Secondary | ICD-10-CM | POA: Insufficient documentation

## 2022-12-02 DIAGNOSIS — M5127 Other intervertebral disc displacement, lumbosacral region: Secondary | ICD-10-CM | POA: Diagnosis not present

## 2022-12-02 DIAGNOSIS — M5137 Other intervertebral disc degeneration, lumbosacral region with discogenic back pain only: Secondary | ICD-10-CM | POA: Diagnosis not present

## 2022-12-16 DIAGNOSIS — Z6826 Body mass index (BMI) 26.0-26.9, adult: Secondary | ICD-10-CM | POA: Diagnosis not present

## 2022-12-16 DIAGNOSIS — Z01419 Encounter for gynecological examination (general) (routine) without abnormal findings: Secondary | ICD-10-CM | POA: Diagnosis not present

## 2022-12-16 DIAGNOSIS — E559 Vitamin D deficiency, unspecified: Secondary | ICD-10-CM | POA: Diagnosis not present

## 2022-12-17 ENCOUNTER — Encounter: Payer: Self-pay | Admitting: Hematology and Oncology

## 2022-12-17 ENCOUNTER — Other Ambulatory Visit (HOSPITAL_COMMUNITY): Payer: Self-pay

## 2022-12-17 MED ORDER — TRUE VITAMIN D3 1.25 MG (50000 UT) PO CAPS
50000.0000 [IU] | ORAL_CAPSULE | ORAL | 3 refills | Status: DC
  Filled 2022-12-17: qty 12, 84d supply, fill #0
  Filled 2023-03-08: qty 12, 84d supply, fill #1
  Filled 2023-05-31: qty 12, 84d supply, fill #2
  Filled 2023-09-07: qty 12, 84d supply, fill #3

## 2022-12-19 DIAGNOSIS — M5440 Lumbago with sciatica, unspecified side: Secondary | ICD-10-CM | POA: Diagnosis not present

## 2023-03-03 ENCOUNTER — Telehealth: Payer: Self-pay | Admitting: Hematology and Oncology

## 2023-03-04 ENCOUNTER — Inpatient Hospital Stay: Payer: Commercial Managed Care - PPO | Attending: Hematology and Oncology

## 2023-03-04 ENCOUNTER — Encounter: Payer: Self-pay | Admitting: Hematology and Oncology

## 2023-03-04 DIAGNOSIS — E538 Deficiency of other specified B group vitamins: Secondary | ICD-10-CM | POA: Insufficient documentation

## 2023-03-04 DIAGNOSIS — D509 Iron deficiency anemia, unspecified: Secondary | ICD-10-CM | POA: Insufficient documentation

## 2023-03-04 LAB — CMP (CANCER CENTER ONLY)
ALT: 12 U/L (ref 0–44)
AST: 12 U/L — ABNORMAL LOW (ref 15–41)
Albumin: 4 g/dL (ref 3.5–5.0)
Alkaline Phosphatase: 65 U/L (ref 38–126)
Anion gap: 6 (ref 5–15)
BUN: 11 mg/dL (ref 6–20)
CO2: 24 mmol/L (ref 22–32)
Calcium: 8.9 mg/dL (ref 8.9–10.3)
Chloride: 108 mmol/L (ref 98–111)
Creatinine: 0.8 mg/dL (ref 0.44–1.00)
GFR, Estimated: >60 mL/min (ref >60)
Glucose, Bld: 131 mg/dL — ABNORMAL HIGH (ref 70–99)
Potassium: 3.6 mmol/L (ref 3.5–5.1)
Sodium: 138 mmol/L (ref 135–145)
Total Bilirubin: 0.6 mg/dL (ref 0.0–1.2)
Total Protein: 6.4 g/dL — ABNORMAL LOW (ref 6.5–8.1)

## 2023-03-04 LAB — CBC WITH DIFFERENTIAL (CANCER CENTER ONLY)
Abs Immature Granulocytes: 0.01 10*3/uL (ref 0.00–0.07)
Basophils Absolute: 0.1 10*3/uL (ref 0.0–0.1)
Basophils Relative: 1 %
Eosinophils Absolute: 0.1 10*3/uL (ref 0.0–0.5)
Eosinophils Relative: 1 %
HCT: 42 % (ref 36.0–46.0)
Hemoglobin: 14 g/dL (ref 12.0–15.0)
Immature Granulocytes: 0 %
Lymphocytes Relative: 19 %
Lymphs Abs: 1 10*3/uL (ref 0.7–4.0)
MCH: 30.4 pg (ref 26.0–34.0)
MCHC: 33.3 g/dL (ref 30.0–36.0)
MCV: 91.1 fL (ref 80.0–100.0)
Monocytes Absolute: 0.3 10*3/uL (ref 0.1–1.0)
Monocytes Relative: 6 %
Neutro Abs: 3.9 10*3/uL (ref 1.7–7.7)
Neutrophils Relative %: 73 %
Platelet Count: 298 10*3/uL (ref 150–400)
RBC: 4.61 MIL/uL (ref 3.87–5.11)
RDW: 11.5 % (ref 11.5–15.5)
WBC Count: 5.4 10*3/uL (ref 4.0–10.5)
nRBC: 0 % (ref 0.0–0.2)

## 2023-03-04 LAB — RETIC PANEL
Immature Retic Fract: 5.8 % (ref 2.3–15.9)
RBC.: 4.46 MIL/uL (ref 3.87–5.11)
Retic Count, Absolute: 58 10*3/uL (ref 19.0–186.0)
Retic Ct Pct: 1.3 % (ref 0.4–3.1)
Reticulocyte Hemoglobin: 33.8 pg (ref >27.9)

## 2023-03-04 LAB — IRON AND IRON BINDING CAPACITY (CC-WL,HP ONLY)
Iron: 112 ug/dL (ref 28–170)
Saturation Ratios: 32 % — ABNORMAL HIGH (ref 10.4–31.8)
TIBC: 347 ug/dL (ref 250–450)
UIBC: 235 ug/dL (ref 148–442)

## 2023-03-04 LAB — VITAMIN B12: Vitamin B-12: 408 pg/mL (ref 180–914)

## 2023-03-04 LAB — FERRITIN: Ferritin: 15 ng/mL (ref 11–307)

## 2023-03-04 NOTE — Telephone Encounter (Signed)
Patient scheduled for 2/5 telephone visit.

## 2023-03-06 ENCOUNTER — Telehealth: Payer: Commercial Managed Care - PPO | Admitting: Hematology and Oncology

## 2023-03-10 ENCOUNTER — Encounter: Payer: Self-pay | Admitting: Hematology and Oncology

## 2023-03-10 ENCOUNTER — Other Ambulatory Visit (HOSPITAL_COMMUNITY): Payer: Self-pay

## 2023-03-17 NOTE — Assessment & Plan Note (Signed)
05/30/20 showed RBC 4.39, HGB 11.6, HCT 36.4, MCV 82.9, PLT 363, Vit D 21, B12 337 07/21/2020: Hemoglobin 13, platelets 351, MCV 86, creatinine 0.81, ferritin 3, iron saturation 6%, TIBC 419   Patient has profound iron deficiency with severe symptoms of fatigue and generalized weakness. This is been ongoing since January 2022 Most likely etiology: Heavy uterine bleeding (this is much improved): skipping cycles sometimes has also helped.   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

## 2023-03-19 ENCOUNTER — Inpatient Hospital Stay: Payer: Commercial Managed Care - PPO | Attending: Hematology and Oncology | Admitting: Hematology and Oncology

## 2023-03-19 DIAGNOSIS — D509 Iron deficiency anemia, unspecified: Secondary | ICD-10-CM

## 2023-03-19 NOTE — Progress Notes (Signed)
 HEMATOLOGY-ONCOLOGY TELEPHONE VISIT PROGRESS NOTE  I connected with our patient on 03/19/23 at 10:45 AM EST by telephone and verified that I am speaking with the correct person using two identifiers.  I discussed the limitations, risks, security and privacy concerns of performing an evaluation and management service by telephone and the availability of in person appointments.  I also discussed with the patient that there may be a patient responsible charge related to this service. The patient expressed understanding and agreed to proceed.   History of Present Illness: Follow-up of iron  deficiency anemia  History of Present Illness   Cassidy Shaffer is a 28 year old female with iron  deficiency anemia who presents for follow-up on her iron  levels and menstruation.  She has a history of iron  deficiency anemia and received iron  treatment in November 2023. Recent lab results show a hemoglobin level of 14, ferritin level of 10, and iron  saturation of 32%, indicating stable iron  levels. She is not experiencing fatigue and feels she can tell when her iron  levels are low.  Her menstruation is now regular and normal, which had previously been irregular due to menorrhagia. The iron  therapy she received has contributed to the normalization of her menstrual cycle.  She continues to take a B12 supplement, and her B12 level is adequate at 408.        REVIEW OF SYSTEMS:   Constitutional: Denies fevers, chills or abnormal weight loss All other systems were reviewed with the patient and are negative. Observations/Objective:     Assessment Plan:  Iron  deficiency anemia 05/30/20 showed RBC 4.39, HGB 11.6, HCT 36.4, MCV 82.9, PLT 363, Vit D 21, B12 337 07/21/2020: Hemoglobin 13, platelets 351, MCV 86, creatinine 0.81, ferritin 3, iron  saturation 6%, TIBC 419   Patient has profound iron  deficiency with severe symptoms of fatigue and generalized weakness. This is been ongoing since January 2022 Most likely  etiology: Heavy uterine bleeding (this is much improved): skipping cycles sometimes has also helped.   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  Especially since her menstrual cycles have markedly improved, she is likely to not require additional IV iron  treatments in the future.  However we will continue to watch and monitor her with repeat of her labs in 6 months and I will do a telephone visit after that to discuss results.  I discussed the assessment and treatment plan with the patient. The patient was provided an opportunity to ask questions and all were answered. The patient agreed with the plan and demonstrated an understanding of the instructions. The patient was advised to call back or seek an in-person evaluation if the symptoms worsen or if the condition fails to improve as anticipated.   I provided 20 minutes  of non-face-to-face time during this encounter.  This includes time for charting and coordination of care   Naomi MARLA Chad, MD

## 2023-03-20 ENCOUNTER — Telehealth: Payer: Self-pay | Admitting: Hematology and Oncology

## 2023-03-20 NOTE — Telephone Encounter (Signed)
 Scheduled appointments per 2/5 los. Left VM with appointment details.

## 2023-04-16 ENCOUNTER — Encounter: Payer: Self-pay | Admitting: Family Medicine

## 2023-04-16 ENCOUNTER — Ambulatory Visit (INDEPENDENT_AMBULATORY_CARE_PROVIDER_SITE_OTHER): Admitting: Family Medicine

## 2023-04-16 ENCOUNTER — Encounter: Payer: Commercial Managed Care - PPO | Admitting: Family Medicine

## 2023-04-16 VITALS — BP 124/70 | HR 67 | Temp 98.2°F | Ht 67.0 in | Wt 165.0 lb

## 2023-04-16 DIAGNOSIS — D508 Other iron deficiency anemias: Secondary | ICD-10-CM

## 2023-04-16 DIAGNOSIS — Z0000 Encounter for general adult medical examination without abnormal findings: Secondary | ICD-10-CM | POA: Diagnosis not present

## 2023-04-16 NOTE — Progress Notes (Signed)
 Subjective:  Patient ID: Cassidy Shaffer, female    DOB: 13-Dec-1995  Age: 28 y.o. MRN: [id number]  Chief Complaint  Patient presents with   Annual Exam    Discussed the use of AI scribe software for clinical note transcription with the patient, who gave verbal consent to proceed.      Well Adult Physical: Patient here for a comprehensive physical exam.The patient reports no problems Do you take any herbs or supplements that were not prescribed by a doctor? no Are you taking calcium supplements? no Are you taking aspirin daily? no  Encounter for general adult medical examination without abnormal findings  Physical ("At Risk" items are starred): Patient's last physical exam was 1 year ago .  Patient is not afflicted from Stress Incontinence and Urge Incontinence  Patient wears a seat belts Patient has smoke detectors and has carbon monoxide detectors. Patient practices appropriate gun safety. Patient wears sunscreen with extended sun exposure. Dental Care: biannual cleanings, brushes and flosses daily. Ophthalmology/Optometry: Annual visit.  Hearing loss: none Vision impairments: none  Menarche: 28 y.o Menstrual History: reg LMP: 03/26/2023 Pregnancy history: 0 Safe at home: yes Self breast exams: yes  Discussed the use of AI scribe software for clinical note transcription with the patient, who gave verbal consent to proceed.   HPI Cassidy Shaffer is a 28 year old female who presents for an annual physical exam with labs. She has a history of low iron levels, which have been monitored every six months. She attributes this to heavy menstrual periods and possibly insufficient red meat intake. She has undergone iron infusions twice, which improved her condition. No new symptoms aside from her known low blood count.  She reports a history of elevated glucose levels, which she attributes to having eaten before the test. Her thyroid was last checked in April of last year. She is  interested in having her cholesterol levels checked, especially after increasing her exercise routine to four days a week.  She has been on vitamin D supplementation, taking 50,000 units once a week, due to previously low levels. 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  She practices regular exercise, uses sunblock, practices gun safety, and attends biannual dental cleanings. No history of hearing loss or visual impairments, although she wears contacts.     04/16/2023    8:40 AM 04/10/2022   11:16 AM 02/15/2021    1:59 PM 12/01/2019    9:25 AM  Depression screen PHQ 2/9  Decreased Interest 0 0 0 0  Down, Depressed, Hopeless 0 0 0 0  PHQ - 2 Score 0 0 0 0  Altered sleeping 0     Tired, decreased energy 0     Change in appetite 0     Feeling bad or failure about yourself  0     Trouble concentrating 0     Moving slowly or fidgety/restless 0     Suicidal thoughts 0     PHQ-9 Score 0     Difficult doing work/chores Not difficult at all            09/12/2020    7:45 AM 02/15/2021    1:58 PM 12/27/2021    2:00 PM 01/07/2022    2:15 PM 04/10/2022   11:16 AM  Fall Risk  Falls in the past year?  0   0  Was there an injury with Fall?  0   0  Fall Risk Category Calculator  0   0  Fall Risk Category (Retired)  Low     (RETIRED) Patient Fall Risk Level Low fall risk Low fall risk Low fall risk Low fall risk   Patient at Risk for Falls Due to  No Fall Risks   No Fall Risks  Fall risk Follow up  Falls evaluation completed   Falls evaluation completed           Social Hx   Social History   Socioeconomic History   Marital status: Single    Spouse name: Not on file   Number of children: Not on file   Years of education: Not on file   Highest education level: Bachelor's degree (e.g., BA, AB, BS)  Occupational History   Not on file  Tobacco Use   Smoking status: Never   Smokeless tobacco: Never  Vaping Use   Vaping  status: Never Used  Substance and Sexual Activity   Alcohol use: Never   Drug use: Never   Sexual activity: Never  Other Topics Concern   Not on file  Social History Narrative   Not on file   Social Drivers of Health   Financial Resource Strain: Low Risk  (04/12/2023)   Overall Financial Resource Strain (CARDIA)    Difficulty of Paying Living Expenses: Not hard at all  Food Insecurity: No Food Insecurity (04/12/2023)   Hunger Vital Sign    Worried About Running Out of Food in the Last Year: Never true    Ran Out of Food in the Last Year: Never true  Transportation Needs: No Transportation Needs (04/12/2023)   PRAPARE - Administrator, Civil Service (Medical): No    Lack of Transportation (Non-Medical): No  Physical Activity: Insufficiently Active (04/12/2023)   Exercise Vital Sign    Days of Exercise per Week: 4 days    Minutes of Exercise per Session: 30 min  Stress: No Stress Concern Present (04/12/2023)   [employer] of Occupational Health - Occupational Stress Questionnaire    Feeling of Stress : Not at all  Social Connections: Moderately Integrated (04/12/2023)   Social Connection and Isolation Panel [NHANES]    Frequency of Communication with Friends and Family: More than three times a week    Frequency of Social Gatherings with Friends and Family: Once a week    Attends Religious Services: More than 4 times per year    Active Member of [employer] or Organizations: Yes    Attends Engineer, structural: More than 4 times per year    Marital Status: Never married   Past Medical History:  Diagnosis Date   Allergy    Epigastric pain    GERD (gastroesophageal reflux disease)    Hyperlipidemia    borderline    Hypertension    past hx in nursing school    Past Surgical History:  Procedure Laterality Date   WISDOM TOOTH EXTRACTION      Family History  Problem Relation Age of Onset   Hyperlipidemia Mother    Osteoporosis Mother    Fibromyalgia Mother     Hypertension Father    Esophagitis Father    Hyperlipidemia Father    Colon polyps Father    Hypertension Maternal Grandmother    Diabetes Mellitus II Maternal Grandfather    COPD Maternal Grandfather    Hyperlipidemia Maternal Grandfather    Hypertension Maternal Grandfather    Hypertension Paternal Grandmother    Hyperlipidemia Paternal Grandmother  Hypertension Paternal Grandfather    Stroke Paternal Grandfather    Hyperlipidemia Paternal Grandfather    Heart attack Paternal Grandfather    Colon polyps Paternal Grandfather    Pancreatic cancer Maternal Great-grandfather    Colon cancer Neg Hx    Esophageal cancer Neg Hx    Rectal cancer Neg Hx    Stomach cancer Neg Hx     Review of Systems  Constitutional:  Negative for chills, fatigue and fever.  HENT:  Negative for congestion, ear pain, rhinorrhea and sore throat.   Respiratory:  Negative for cough and shortness of breath.   Cardiovascular:  Negative for chest pain.  Gastrointestinal:  Negative for abdominal pain, constipation, diarrhea, nausea and vomiting.  Genitourinary:  Negative for dysuria and urgency.  Musculoskeletal:  Negative for back pain and myalgias.  Skin:  Negative for rash.  Neurological:  Negative for dizziness, weakness, light-headedness and headaches.  Psychiatric/Behavioral:  Negative for dysphoric mood. The patient is not nervous/anxious.      Objective:  BP 124/70   Pulse 67   Temp 98.2 F (36.8 C)   Ht 5\' 7"  (1.702 m)   Wt 165 lb (74.8 kg)   SpO2 98%   BMI 25.84 kg/m      04/16/2023    8:37 AM 04/10/2022   11:15 AM 01/07/2022    5:17 PM  BP/Weight  Systolic BP 124 124 115  Diastolic BP 70 74 83  Wt. (Lbs) 165 175   BMI 25.84 kg/m2 27.41 kg/m2     Physical Exam Vitals reviewed.  Constitutional:      General: She is not in acute distress.    Appearance: Normal appearance. She is well-groomed and normal weight. She is not ill-appearing.  HENT:     Head: Normocephalic.      Right Ear: Tympanic membrane, ear canal and external ear normal.     Left Ear: Tympanic membrane, ear canal and external ear normal.     Nose: Nose normal.     Right Sinus: No maxillary sinus tenderness or frontal sinus tenderness.     Left Sinus: No maxillary sinus tenderness or frontal sinus tenderness.     Mouth/Throat:     Mouth: Mucous membranes are moist.     Pharynx: Oropharynx is clear. No posterior oropharyngeal erythema.  Eyes:     Extraocular Movements: Extraocular movements intact.     Conjunctiva/sclera: Conjunctivae normal.     Pupils: Pupils are equal, round, and reactive to light.  Cardiovascular:     Rate and Rhythm: Normal rate and regular rhythm.     Pulses: Normal pulses.     Heart sounds: Normal heart sounds. No murmur heard. Pulmonary:     Effort: Pulmonary effort is normal. No respiratory distress.     Breath sounds: Normal breath sounds.  Abdominal:     General: Abdomen is flat. Bowel sounds are normal.     Palpations: Abdomen is soft.     Tenderness: There is no abdominal tenderness.  Musculoskeletal:        General: No swelling, tenderness or deformity. Normal range of motion.     Cervical back: Normal range of motion and neck supple. No tenderness.  Lymphadenopathy:     Cervical: No cervical adenopathy.  Skin:    General: Skin is warm and dry.  Neurological:     Mental Status: She is alert and oriented to person, place, and time. Mental status is at baseline.     Cranial Nerves: Cranial nerves 2-12  are intact.     Deep Tendon Reflexes: Reflexes are normal and symmetric.  Psychiatric:        Attention and Perception: Attention normal.        Mood and Affect: Mood normal.        Behavior: Behavior normal.        Thought Content: Thought content normal.        Cognition and Memory: Cognition normal.        Judgment: Judgment normal.     Lab Results  Component Value Date   WBC 5.4 03/04/2023   HGB 14.0 03/04/2023   HCT 42.0 03/04/2023   PLT 298  03/04/2023   GLUCOSE 131 (H) 03/04/2023   ALT 12 03/04/2023   AST 12 (L) 03/04/2023   NA 138 03/04/2023   K 3.6 03/04/2023   CL 108 03/04/2023   CREATININE 0.80 03/04/2023   BUN 11 03/04/2023   CO2 24 03/04/2023   TSH 2.55 05/30/2020      Assessment & Plan:  Routine medical exam Assessment & Plan: Things to do to keep yourself healthy  - Exercise at least 30-45 minutes a day, 3-4 days a week.  - Eat a low-fat diet with lots of fruits and vegetables, up to 7-9 servings per day.  - Seatbelts can save your life. Wear them always.  - Smoke detectors on every level of your home, check batteries every year.  - Eye Doctor - have an eye exam every 1-2 years  - Safe sex - if you may be exposed to STDs, use a condom.  - Alcohol -  If you drink, do it moderately, less than 2 drinks per day.  - Health Care Power of Attorney. Choose someone to speak for you if you are not able.  - Depression is common in our stressful world.If you're feeling down or losing interest in things you normally enjoy, please come in for a visit.  - Violence - If anyone is threatening or hurting you, please call immediately.  - Labs drawn today, Await labs/testing for assessment and recommendations   Orders: -     Lipid panel -     TSH  Iron deficiency anemia secondary to inadequate dietary iron intake Assessment & Plan: History of low iron levels, possibly due to heavy periods and diet. Has received iron infusions in the past. Currently on Vitamin D supplementation for low levels. -Continue monitoring iron levels every six months as currently being done.     Body mass index is 25.84 kg/m.   These are the goals we discussed:  Goals      Exercise 3x per week (30 min per time)         This is a list of the screening recommended for you and due dates:  Health Maintenance  Topic Date Due   Pap Smear  Never done   DTaP/Tdap/Td vaccine (2 - Td or Tdap) 08/12/2027   Flu Shot  Completed   HPV Vaccine   Aged Out   COVID-19 Vaccine  Discontinued   Hepatitis C Screening  Discontinued   HIV Screening  Discontinued     No orders of the defined types were placed in this encounter.   Follow-up: No follow-ups on file.  An After Visit Summary was printed and given to the patient.  Lajuana Matte, FNP [facility] [phone]

## 2023-04-16 NOTE — Assessment & Plan Note (Signed)
 History of low iron levels, possibly due to heavy periods and diet. Has received iron infusions in the past. Currently on Vitamin D supplementation for low levels. -Continue monitoring iron levels every six months as currently being done.

## 2023-04-16 NOTE — Assessment & Plan Note (Signed)

## 2023-04-17 LAB — LIPID PANEL
Chol/HDL Ratio: 3.6 ratio (ref 0.0–4.4)
Cholesterol, Total: 171 mg/dL (ref 100–199)
HDL: 47 mg/dL (ref >39)
LDL Chol Calc (NIH): 112 mg/dL — ABNORMAL HIGH (ref 0–99)
Triglycerides: 63 mg/dL (ref 0–149)
VLDL Cholesterol Cal: 12 mg/dL (ref 5–40)

## 2023-04-17 LAB — TSH: TSH: 1.87 u[IU]/mL (ref 0.450–4.500)

## 2023-05-15 DIAGNOSIS — H5213 Myopia, bilateral: Secondary | ICD-10-CM | POA: Diagnosis not present

## 2023-06-02 ENCOUNTER — Other Ambulatory Visit (HOSPITAL_COMMUNITY): Payer: Self-pay

## 2023-06-02 ENCOUNTER — Encounter: Payer: Self-pay | Admitting: Hematology and Oncology

## 2023-08-05 ENCOUNTER — Encounter: Payer: Self-pay | Admitting: Hematology and Oncology

## 2023-08-05 ENCOUNTER — Other Ambulatory Visit (HOSPITAL_COMMUNITY): Payer: Self-pay

## 2023-08-05 DIAGNOSIS — L603 Nail dystrophy: Secondary | ICD-10-CM | POA: Diagnosis not present

## 2023-08-05 DIAGNOSIS — D2221 Melanocytic nevi of right ear and external auricular canal: Secondary | ICD-10-CM | POA: Diagnosis not present

## 2023-08-05 DIAGNOSIS — D2239 Melanocytic nevi of other parts of face: Secondary | ICD-10-CM | POA: Diagnosis not present

## 2023-08-05 DIAGNOSIS — D235 Other benign neoplasm of skin of trunk: Secondary | ICD-10-CM | POA: Diagnosis not present

## 2023-08-05 DIAGNOSIS — L738 Other specified follicular disorders: Secondary | ICD-10-CM | POA: Diagnosis not present

## 2023-08-05 DIAGNOSIS — L820 Inflamed seborrheic keratosis: Secondary | ICD-10-CM | POA: Diagnosis not present

## 2023-08-05 MED ORDER — CLINDAMYCIN PHOSPHATE 1 % EX LOTN
TOPICAL_LOTION | Freq: Every day | CUTANEOUS | 11 refills | Status: AC
  Filled 2023-08-05: qty 60, 30d supply, fill #0
  Filled 2023-09-07: qty 60, 30d supply, fill #1

## 2023-08-22 ENCOUNTER — Ambulatory Visit: Admitting: Podiatry

## 2023-08-22 DIAGNOSIS — L84 Corns and callosities: Secondary | ICD-10-CM

## 2023-08-22 DIAGNOSIS — M2042 Other hammer toe(s) (acquired), left foot: Secondary | ICD-10-CM | POA: Diagnosis not present

## 2023-08-22 DIAGNOSIS — B351 Tinea unguium: Secondary | ICD-10-CM

## 2023-08-22 DIAGNOSIS — L609 Nail disorder, unspecified: Secondary | ICD-10-CM | POA: Diagnosis not present

## 2023-08-22 NOTE — Progress Notes (Unsigned)
  Left 2nd and 3rd toenail thickness.  She does have mallet toe and slight hammertoe deformities to the left 2nd and 3rd toes.  She does walk and occasionally runs but she is cut back due to a knee injury.  Fungal nail culture obtained of the left second toenail.  Also had secondary concern of rough skin around the heels and submet 5.  These were sanded.  Recommended urea 40 cream with 2% salicylic acid.  She works at [employer], [place] Complaint  Patient presents with   Nail Problem    Left 2nd is thickened. Ongoing for a couple of months. Has tried an otc topical antifungal once. Her left 3rd is showing signs of thickening as well. She would also like your opinion on her heels, which have thick and dry skin. She has tried moisturizing with minimal results. Not diabetic and no anticoag.     HPI: 28 y.o. female presents today ***  Past Medical History:  Diagnosis Date   Allergy    Epigastric pain    GERD (gastroesophageal reflux disease)    Hyperlipidemia    borderline    Hypertension    past hx in nursing school    Past Surgical History:  Procedure Laterality Date   WISDOM TOOTH EXTRACTION     No Known Allergies ROS    Physical Exam: ***  Radiographic Exam:  Normal osseous mineralization. No fractures noted.  Assessment/Plan of Care: No diagnosis found.   No orders of the defined types were placed in this encounter.  None  ***   Kelwin Gibler DSABRA [place], DPM, FACFAS [facility]     [address] [place], [place] [address]                Office [phone]  Fax [phone]

## 2023-08-29 ENCOUNTER — Ambulatory Visit: Payer: Self-pay | Admitting: Podiatry

## 2023-09-08 ENCOUNTER — Encounter: Payer: Self-pay | Admitting: Hematology and Oncology

## 2023-09-08 ENCOUNTER — Other Ambulatory Visit (HOSPITAL_COMMUNITY): Payer: Self-pay

## 2023-09-16 ENCOUNTER — Inpatient Hospital Stay: Payer: Commercial Managed Care - PPO | Attending: Hematology and Oncology

## 2023-09-16 DIAGNOSIS — D509 Iron deficiency anemia, unspecified: Secondary | ICD-10-CM | POA: Diagnosis not present

## 2023-09-16 DIAGNOSIS — E538 Deficiency of other specified B group vitamins: Secondary | ICD-10-CM | POA: Diagnosis not present

## 2023-09-16 LAB — CBC WITH DIFFERENTIAL (CANCER CENTER ONLY)
Abs Immature Granulocytes: 0.01 K/uL (ref 0.00–0.07)
Basophils Absolute: 0.1 K/uL (ref 0.0–0.1)
Basophils Relative: 1 %
Eosinophils Absolute: 0.1 K/uL (ref 0.0–0.5)
Eosinophils Relative: 2 %
HCT: 40.4 % (ref 36.0–46.0)
Hemoglobin: 13.7 g/dL (ref 12.0–15.0)
Immature Granulocytes: 0 %
Lymphocytes Relative: 21 %
Lymphs Abs: 1 K/uL (ref 0.7–4.0)
MCH: 29.7 pg (ref 26.0–34.0)
MCHC: 33.9 g/dL (ref 30.0–36.0)
MCV: 87.6 fL (ref 80.0–100.0)
Monocytes Absolute: 0.4 K/uL (ref 0.1–1.0)
Monocytes Relative: 9 %
Neutro Abs: 3 K/uL (ref 1.7–7.7)
Neutrophils Relative %: 67 %
Platelet Count: 263 K/uL (ref 150–400)
RBC: 4.61 MIL/uL (ref 3.87–5.11)
RDW: 12.4 % (ref 11.5–15.5)
WBC Count: 4.5 K/uL (ref 4.0–10.5)
nRBC: 0 % (ref 0.0–0.2)

## 2023-09-16 LAB — RETIC PANEL
Immature Retic Fract: 6.7 % (ref 2.3–15.9)
RBC.: 4.54 MIL/uL (ref 3.87–5.11)
Retic Count, Absolute: 58.6 K/uL (ref 19.0–186.0)
Retic Ct Pct: 1.3 % (ref 0.4–3.1)
Reticulocyte Hemoglobin: 33.5 pg (ref >27.9)

## 2023-09-16 LAB — FERRITIN: Ferritin: 15 ng/mL (ref 11–307)

## 2023-09-16 LAB — VITAMIN B12: Vitamin B-12: 410 pg/mL (ref 180–914)

## 2023-09-16 LAB — IRON AND IRON BINDING CAPACITY (CC-WL,HP ONLY)
Iron: 114 ug/dL (ref 28–170)
Saturation Ratios: 31 % (ref 10.4–31.8)
TIBC: 374 ug/dL (ref 250–450)
UIBC: 260 ug/dL (ref 148–442)

## 2023-09-18 ENCOUNTER — Telehealth: Payer: Commercial Managed Care - PPO | Admitting: Hematology and Oncology

## 2023-09-22 NOTE — Assessment & Plan Note (Signed)
 05/30/20 showed RBC 4.39, HGB 11.6, HCT 36.4, MCV 82.9, PLT 363, Vit D 21, B12 337 07/21/2020: Hemoglobin 13, platelets 351, MCV 86, creatinine 0.81, ferritin 3, iron  saturation 6%, TIBC 419   Patient has profound iron  deficiency with severe symptoms of fatigue and generalized weakness. This is been ongoing since January 2022 Most likely etiology: Heavy uterine bleeding (this is much improved): skipping cycles sometimes has also helped.   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 

## 2023-09-23 ENCOUNTER — Inpatient Hospital Stay: Admitting: Hematology and Oncology

## 2023-09-23 DIAGNOSIS — D508 Other iron deficiency anemias: Secondary | ICD-10-CM

## 2023-09-23 DIAGNOSIS — D509 Iron deficiency anemia, unspecified: Secondary | ICD-10-CM | POA: Diagnosis not present

## 2023-09-23 NOTE — Progress Notes (Signed)
 HEMATOLOGY-ONCOLOGY TELEPHONE VISIT PROGRESS NOTE  I connected with our patient on 09/23/23 at  8:30 AM EDT by telephone and verified that I am speaking with the correct person using two identifiers.  I discussed the limitations, risks, security and privacy concerns of performing an evaluation and management service by telephone and the availability of in person appointments.  I also discussed with the patient that there may be a patient responsible charge related to this service. The patient expressed understanding and agreed to proceed.   History of Present Illness: Telephone follow-up of iron  deficiency anemia  History of Present Illness Cassidy Shaffer is a 28 year old female who presents for follow-up on her iron  status.  Her iron  levels, hemoglobin, and iron  saturation remain unchanged since January. Ferritin is currently 15, which is low normal. Previously, ferritin levels were in the fifties and sixties following iron  infusions. She continues to take an over-the-counter B12 supplement. She is asymptomatic.      REVIEW OF SYSTEMS:   Constitutional: Denies fevers, chills or abnormal weight loss All other systems were reviewed with the patient and are negative. Observations/Objective:     Assessment Plan:  Iron  deficiency anemia 05/30/20 showed RBC 4.39, HGB 11.6, HCT 36.4, MCV 82.9, PLT 363, Vit D 21, B12 337 07/21/2020: Hemoglobin 13, platelets 351, MCV 86, creatinine 0.81, ferritin 3, iron  saturation 6%, TIBC 419   Patient has profound iron  deficiency with severe symptoms of fatigue and generalized weakness. This is been ongoing since January 2022 Most likely etiology: Heavy uterine bleeding (this is much improved): skipping cycles sometimes has also helped.   IV iron : July 2022, Nov 2023   Lab review: 10/13/2020: Celiac antibodies: Negative, hemoglobin 12.8, MCV 86.6, RDW 16.1, platelets 270, iron  saturation 28%, ferritin 56 02/15/2021: Hemoglobin 14.5, MCV 91, ferritin 26, iron   saturation 19% 08/03/2021: Hb 13.2, MCV 88, platelets 270, iron  saturation 21%, ferritin 17 ([facility]) 12/07/2021: Hemoglobin 12.9, MCV 89.5, ferritin 14, iron  saturation 24%, TIBC 322 08/27/2022: Hb 14.2, B 12: 198, Ferritin: 60, Iron  Sat 56%, retics: 1.5% 03/04/23: Hb 14, Ferritin 15, Iron  Sat: 32%, B12 408 09/16/2023: Hemoglobin 13.7, B12 410, iron  saturation 31%, ferritin 15   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 Ferritin is low normal but stable. Iron  saturation is consistent. B12 levels are maintained with supplementation. No IV iron  needed as she is asymptomatic. - Continue B12 supplement 1000 mcg oral daily. - Recheck iron  levels in 6 months. - Schedule follow-up phone call in 6 months.      I discussed the assessment and treatment plan with the patient. The patient was provided an opportunity to ask questions and all were answered. The patient agreed with the plan and demonstrated an understanding of the instructions. The patient was advised to call back or seek an in-person evaluation if the symptoms worsen or if the condition fails to improve as anticipated.   I provided 20 minutes of non-face-to-face time during this encounter.  This includes time for charting and coordination of care   Naomi MARLA Chad, MD

## 2023-11-18 ENCOUNTER — Encounter (HOSPITAL_COMMUNITY): Payer: Self-pay | Admitting: Pharmacist

## 2023-11-18 ENCOUNTER — Encounter: Payer: Self-pay | Admitting: Hematology and Oncology

## 2023-11-18 ENCOUNTER — Other Ambulatory Visit (HOSPITAL_COMMUNITY): Payer: Self-pay

## 2023-11-18 MED ORDER — VITAMIN D3 1.25 MG (50000 UT) PO CAPS
1.0000 | ORAL_CAPSULE | ORAL | 0 refills | Status: AC
  Filled 2023-11-18: qty 12, 84d supply, fill #0

## 2023-11-19 ENCOUNTER — Encounter: Payer: Self-pay | Admitting: Pharmacist

## 2023-11-19 ENCOUNTER — Other Ambulatory Visit: Payer: Self-pay

## 2023-11-21 ENCOUNTER — Other Ambulatory Visit: Payer: Self-pay | Admitting: Medical Genetics

## 2023-11-21 ENCOUNTER — Other Ambulatory Visit (HOSPITAL_COMMUNITY): Payer: Self-pay

## 2023-11-28 DIAGNOSIS — N6019 Diffuse cystic mastopathy of unspecified breast: Secondary | ICD-10-CM | POA: Diagnosis not present

## 2023-12-12 ENCOUNTER — Other Ambulatory Visit: Payer: Self-pay | Admitting: Family

## 2023-12-12 DIAGNOSIS — N6311 Unspecified lump in the right breast, upper outer quadrant: Secondary | ICD-10-CM

## 2023-12-15 ENCOUNTER — Ambulatory Visit
Admission: RE | Admit: 2023-12-15 | Discharge: 2023-12-15 | Disposition: A | Discharge status: Home / Self Care | Source: Ambulatory Visit | Attending: Family | Admitting: Family

## 2023-12-15 ENCOUNTER — Other Ambulatory Visit: Payer: Self-pay | Admitting: Family

## 2023-12-15 ENCOUNTER — Other Ambulatory Visit

## 2023-12-15 DIAGNOSIS — N6311 Unspecified lump in the right breast, upper outer quadrant: Secondary | ICD-10-CM | POA: Diagnosis not present

## 2023-12-15 DIAGNOSIS — R928 Other abnormal and inconclusive findings on diagnostic imaging of breast: Secondary | ICD-10-CM

## 2023-12-17 ENCOUNTER — Other Ambulatory Visit

## 2023-12-23 ENCOUNTER — Other Ambulatory Visit: Payer: Self-pay | Admitting: General Surgery

## 2023-12-23 DIAGNOSIS — N6310 Unspecified lump in the right breast, unspecified quadrant: Secondary | ICD-10-CM

## 2023-12-30 ENCOUNTER — Ambulatory Visit
Admission: RE | Admit: 2023-12-30 | Discharge: 2023-12-30 | Disposition: A | Discharge status: Home / Self Care | Source: Ambulatory Visit | Attending: General Surgery | Admitting: General Surgery

## 2023-12-30 DIAGNOSIS — N6311 Unspecified lump in the right breast, upper outer quadrant: Secondary | ICD-10-CM | POA: Diagnosis not present

## 2023-12-30 DIAGNOSIS — N6310 Unspecified lump in the right breast, unspecified quadrant: Secondary | ICD-10-CM

## 2023-12-30 HISTORY — PX: BREAST BIOPSY: SHX20

## 2023-12-31 LAB — SURGICAL PATHOLOGY

## 2024-01-12 DIAGNOSIS — Z124 Encounter for screening for malignant neoplasm of cervix: Secondary | ICD-10-CM | POA: Diagnosis not present

## 2024-01-12 DIAGNOSIS — Z8639 Personal history of other endocrine, nutritional and metabolic disease: Secondary | ICD-10-CM | POA: Diagnosis not present

## 2024-01-12 DIAGNOSIS — Z01419 Encounter for gynecological examination (general) (routine) without abnormal findings: Secondary | ICD-10-CM | POA: Diagnosis not present

## 2024-01-12 DIAGNOSIS — Z6825 Body mass index (BMI) 25.0-25.9, adult: Secondary | ICD-10-CM | POA: Diagnosis not present

## 2024-01-20 ENCOUNTER — Other Ambulatory Visit

## 2024-02-24 ENCOUNTER — Other Ambulatory Visit: Payer: Self-pay | Admitting: Medical Genetics

## 2024-02-24 DIAGNOSIS — Z006 Encounter for examination for normal comparison and control in clinical research program: Secondary | ICD-10-CM

## 2024-03-25 ENCOUNTER — Inpatient Hospital Stay

## 2024-03-29 ENCOUNTER — Telehealth: Admitting: Hematology and Oncology

## 2024-04-01 ENCOUNTER — Inpatient Hospital Stay: Admitting: Hematology and Oncology

## 2024-05-21 ENCOUNTER — Encounter: Admitting: Family Medicine

## 2024-06-15 ENCOUNTER — Other Ambulatory Visit
